# Patient Record
Sex: Male | Born: 1960 | Race: Asian | Hispanic: No | Marital: Married | Smoking: Never smoker
Health system: Southern US, Community
[De-identification: ages and names within clinical notes are randomized; demographics above are authoritative.]

## PROBLEM LIST (undated history)

## (undated) DIAGNOSIS — K5190 Ulcerative colitis, unspecified, without complications: Secondary | ICD-10-CM

## (undated) HISTORY — DX: Ulcerative colitis, unspecified, without complications: K51.90

---

## 1987-03-15 DIAGNOSIS — A150 Tuberculosis of lung: Secondary | ICD-10-CM

## 1987-03-15 HISTORY — DX: Tuberculosis of lung: A15.0

## 2008-03-14 DIAGNOSIS — C069 Malignant neoplasm of mouth, unspecified: Secondary | ICD-10-CM

## 2008-03-14 HISTORY — PX: MOUTH SURGERY: SHX715

## 2008-03-14 HISTORY — DX: Malignant neoplasm of mouth, unspecified: C06.9

## 2019-04-30 ENCOUNTER — Encounter: Payer: Self-pay | Admitting: Gastroenterology

## 2019-05-08 NOTE — Progress Notes (Deleted)
Referring Provider: Dr. Woody Seller Primary Care Physician:  No primary care provider on file. Primary Gastroenterologist:  Dr. Marland Kitchen  No chief complaint on file.   HPI:   Carl Reynolds is a 59 y.o. male presenting today at the request of Dr. Woody Seller for blood in the stool and abnormal LFTs.     No past medical history on file.  *** The histories are not reviewed yet. Please review them in the "History" navigator section and refresh this [place].  No current outpatient medications on file.   No current facility-administered medications for this visit.    Allergies as of 05/09/2019  . (Not on File)    No family history on file.  Social History   Socioeconomic History  . Marital status: Not on file    Spouse name: Not on file  . Number of children: Not on file  . Years of education: Not on file  . Highest education level: Not on file  Occupational History  . Not on file  Tobacco Use  . Smoking status: Not on file  Substance and Sexual Activity  . Alcohol use: Not on file  . Drug use: Not on file  . Sexual activity: Not on file  Other Topics Concern  . Not on file  Social History Narrative  . Not on file   Social Determinants of Health   Financial Resource Strain:   . Difficulty of Paying Living Expenses: Not on file  Food Insecurity:   . Worried About Charity fundraiser in the Last Year: Not on file  . Ran Out of Food in the Last Year: Not on file  Transportation Needs:   . Lack of Transportation (Medical): Not on file  . Lack of Transportation (Non-Medical): Not on file  Physical Activity:   . Days of Exercise per Week: Not on file  . Minutes of Exercise per Session: Not on file  Stress:   . Feeling of Stress : Not on file  Social Connections:   . Frequency of Communication with Friends and Family: Not on file  . Frequency of Social Gatherings with Friends and Family: Not on file  . Attends Religious Services: Not on file  . Active Member of Clubs or  Organizations: Not on file  . Attends Archivist Meetings: Not on file  . Marital Status: Not on file  Intimate Partner Violence:   . Fear of Current or Ex-Partner: Not on file  . Emotionally Abused: Not on file  . Physically Abused: Not on file  . Sexually Abused: Not on file    Review of Systems: Gen: Denies any fever, chills, fatigue, weight loss, lack of appetite.  CV: Denies chest pain, heart palpitations, peripheral edema, syncope.  Resp: Denies shortness of breath at rest or with exertion. Denies wheezing or cough.  GI: Denies dysphagia or odynophagia. Denies jaundice, hematemesis, fecal incontinence. GU : Denies urinary burning, urinary frequency, urinary hesitancy MS: Denies joint pain, muscle weakness, cramps, or limitation of movement.  Derm: Denies rash, itching, dry skin Psych: Denies depression, anxiety, memory loss, and confusion Heme: Denies bruising, bleeding, and enlarged lymph nodes.  Physical Exam: There were no vitals taken for this visit. General:   Alert and oriented. Pleasant and cooperative. Well-nourished and well-developed.  Head:  Normocephalic and atraumatic. Eyes:  Without icterus, sclera clear and conjunctiva pink.  Ears:  Normal auditory acuity. Nose:  No deformity, discharge,  or lesions. Mouth:  No deformity or lesions, oral mucosa pink.  Neck:  Supple, without mass or thyromegaly. Lungs:  Clear to auscultation bilaterally. No wheezes, rales, or rhonchi. No distress.  Heart:  S1, S2 present without murmurs appreciated.  Abdomen:  +BS, soft, non-tender and non-distended. No HSM noted. No guarding or rebound. No masses appreciated.  Rectal:  Deferred  Msk:  Symmetrical without gross deformities. Normal posture. Pulses:  Normal pulses noted. Extremities:  Without clubbing or edema. Neurologic:  Alert and  oriented x4;  grossly normal neurologically. Skin:  Intact without significant lesions or rashes. Cervical Nodes:  No significant  cervical adenopathy. Psych:  Alert and cooperative. Normal mood and affect.

## 2019-05-09 ENCOUNTER — Ambulatory Visit: Payer: Self-pay | Admitting: Gastroenterology

## 2019-05-24 ENCOUNTER — Ambulatory Visit: Payer: 59 | Attending: Internal Medicine

## 2019-05-24 DIAGNOSIS — Z23 Encounter for immunization: Secondary | ICD-10-CM

## 2019-05-24 NOTE — Progress Notes (Signed)
   Covid-19 Vaccination [facility]  Name:  Carl Reynolds    MRN: [id number] DOB: [birth year]  05/24/2019  [name] was observed post Covid-19 immunization for 15 minutes without incident. He was provided with Vaccine Information Sheet and instruction to access the V-Safe system.   [name] was instructed to call 911 with any severe reactions post vaccine: Marland Kitchen Difficulty breathing  . Swelling of face and throat  . A fast heartbeat  . A bad rash all over body  . Dizziness and weakness   Immunizations Administered    Name Date Dose VIS Date Route   Moderna COVID-19 Vaccine 05/24/2019 11:07 AM 0.5 mL 02/12/2019 Intramuscular   Manufacturer: Moderna   [id number]   De [id number]    [email or web address]

## 2019-05-26 NOTE — H&P (View-Only) (Signed)
Referring Provider: Dr. Woody Seller Primary Care Physician:  Glenda Chroman, MD Primary Gastroenterologist:  Dr. Gala Romney  Chief Complaint  Patient presents with  . abnormal lft  . Blood In Stools    last episode 3 days ago  . Diarrhea    since yesterday    HPI:   Carl Reynolds is a 59 y.o. male presenting today at the request of Dr. Woody Seller for blood in the stool and abnormal LFTs.   Reviewed PCP note dated 04/23/2019.  Patient reported diarrhea that had been gradual and occurring in an intermittent pattern for 4 weeks.  Also reported blood in the stool.  Appears he was treated with ciprofloxacin 500 mg twice daily x7 days.  Labs with PCP completed on 04/23/2019: CBC: WBC 9.6, hemoglobin 12.6 (L), MCV 84, MCH 28.4, MCHC 33.7, platelets 354 TSH 4.45 CMP: Glucose 89, creatinine 0.84, sodium 136, potassium 4.8, chloride 101, calcium 9.5, albumin 4.1, total bilirubin 0.6, alk phos 168 (H), AST 56 (H), ALT 92 (H).   Today:  Presents today with a friend who helps with translation.  Patient does understand English and is able to respond independently about 50% of the time.  Moderna COVID vaccine 2 days ago (Saturday). Had fever Saturday evening. No fever Sunday. Diarrhea started Sunday.  Had 6 or 7 watery BMs on Sunday.  So far, has had 3 BMs today.  Mild improvement.  Watery. Not taking anything. No abdominal pain. No nausea or vomiting.  Admits to mild weakness.  Denies lightheadedness, dizziness, presyncope, or syncope.  Had diarrhea, abdominal cramping, and blood in the stools a few months ago. These symptoms had resolved prior to Sunday. No prior antibiotics. Drinks bottled water. No contact with livestock. No hospitalization. No sick contacts.  Blood in the stool: Last episode was 3 days ago. Water turns red.  Blood is also in the stool and on toilet tissue when wiping. Started about 3 months. Occurring every 3-4 days.  Denies any known hemorrhoids. No rectal pain. No burning or itching. No prior  colonoscopy. Personal history of mouth cancer s/p resection in 2010. No family history of colon cancer or IBD. No weight loss.   No heart burn, acid reflux, or dysphagia.  Moved here from [place] about 4 years ago. Has 3 daughters and 1 son here.   Elevated LFTs: No history of alcohol or drug use ever. No contact with anyone with hepatitis. No tattoos. No prior incarcerations. No family history of liver disease. No family history of autoimmune disease. No over the counter the counter supplements. Took tylenol after receiving Covid vaccine. None routinely.  Denies swelling in the abdomen or lower extremities, confusion or mental status changes, yellowing of the eyes, or dark urine.  Past Medical History:  Diagnosis Date  . 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 Pulmonary tuberculosis 1989   Treated in [place]    Past Surgical History:  Procedure Laterality Date  . MOUTH SURGERY  2010   Surgery for mouth cancer    No current outpatient medications on file.   No current facility-administered medications for this visit.    Allergies as of 05/27/2019  . (No Known Allergies)    Family History  Problem Relation Age of Onset  . Colon cancer Neg Hx   . Inflammatory bowel disease Neg Hx     Social History   Socioeconomic History  . Marital status: Married    Spouse name: Not on file  . Number  of children: Not on file  . Years of education: Not on file  . 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   Tobacco Use  . Smoking status: Never Smoker  . Smokeless tobacco: Never Used  Substance and Sexual Activity  . Alcohol use: Never  . Drug use: Never  . Sexual activity: Not on file  Other Topics Concern  . Not on file  Social History Narrative  . Not on file   Social Determinants of Health   Financial Resource Strain:   . Difficulty of Paying Living Expenses:   Food Insecurity:   . Worried About  Running Out of Food in the Last Year:   . Ran Out of Food in the Last Year:   Transportation Needs:   . Lack of Transportation (Medical):   . Lack of Transportation (Non-Medical):   Physical Activity:   . Days of Exercise per Week:   . Minutes of Exercise per Session:   Stress:   . Feeling of Stress :   Social Connections:   . Frequency of Communication with Friends and Family:   . Frequency of Social Gatherings with Friends and Family:   . Attends Religious Services:   . Active Member of Clubs or Organizations:   . Attends Club or Organization Meetings:   . Marital Status:   Intimate Partner Violence:   . Fear of Current or Ex-Partner:   . Emotionally Abused:   . Physically Abused:   . Sexually Abused:     Review of Systems: Gen: See HPI CV: Denies chest pain or heart palpitations.  Resp: Denies shortness of breath.  Admits to occasional cough. GI: See HPI GU : Denies urinary burning, urinary frequency, urinary hesitancy MS: Denies joint pain. Derm: Denies rash Psych: Denies depression or anxiety. Heme: See HPI.  Physical Exam: BP 125/82   Pulse 95   Temp (!) 97 F (36.1 C) (Oral)   Ht 5' 5" (1.651 m)   Wt 140 lb (63.5 kg)   BMI 23.30 kg/m  General:   Alert and oriented. Pleasant and cooperative. Well-nourished and well-developed.  Head:  Normocephalic and atraumatic. Eyes:  Without icterus, sclera clear and conjunctiva pink.  Ears:  Normal auditory acuity. Lungs:  Clear to auscultation bilaterally. No wheezes, rales, or rhonchi. No distress.  Heart:  S1, S2 present without murmurs appreciated.  Abdomen:  +BS, soft, non-tender and non-distended. No HSM noted. No guarding or rebound. No masses appreciated.  Rectal:  Deferred  Msk:  Symmetrical without gross deformities. Normal posture. Extremities:  Without edema. Neurologic:  Alert and  oriented x4;  grossly normal neurologically. Skin:  Intact without significant lesions or rashes. Psych: Normal mood and  affect. 

## 2019-05-26 NOTE — Progress Notes (Signed)
Referring Provider: Dr. Woody Seller Primary Care Physician:  Glenda Chroman, MD Primary Gastroenterologist:  Dr. Gala Romney  Chief Complaint  Patient presents with  . abnormal lft  . Blood In Stools    last episode 3 days ago  . Diarrhea    since yesterday    HPI:   Carl Reynolds is a 59 y.o. male presenting today at the request of Dr. Woody Seller for blood in the stool and abnormal LFTs.   Reviewed PCP note dated 04/23/2019.  Patient reported diarrhea that had been gradual and occurring in an intermittent pattern for 4 weeks.  Also reported blood in the stool.  Appears he was treated with ciprofloxacin 500 mg twice daily x7 days.  Labs with PCP completed on 04/23/2019: CBC: WBC 9.6, hemoglobin 12.6 (L), MCV 84, MCH 28.4, MCHC 33.7, platelets 354 TSH 4.45 CMP: Glucose 89, creatinine 0.84, sodium 136, potassium 4.8, chloride 101, calcium 9.5, albumin 4.1, total bilirubin 0.6, alk phos 168 (H), AST 56 (H), ALT 92 (H).   Today:  Presents today with a friend who helps with translation.  Patient does understand English and is able to respond independently about 50% of the time.  Moderna COVID vaccine 2 days ago (Saturday). Had fever Saturday evening. No fever Sunday. Diarrhea started Sunday.  Had 6 or 7 watery BMs on Sunday.  So far, has had 3 BMs today.  Mild improvement.  Watery. Not taking anything. No abdominal pain. No nausea or vomiting.  Admits to mild weakness.  Denies lightheadedness, dizziness, presyncope, or syncope.  Had diarrhea, abdominal cramping, and blood in the stools a few months ago. These symptoms had resolved prior to Sunday. No prior antibiotics. Drinks bottled water. No contact with livestock. No hospitalization. No sick contacts.  Blood in the stool: Last episode was 3 days ago. Water turns red.  Blood is also in the stool and on toilet tissue when wiping. Started about 3 months. Occurring every 3-4 days.  Denies any known hemorrhoids. No rectal pain. No burning or itching. No prior  colonoscopy. Personal history of mouth cancer s/p resection in 2010. No family history of colon cancer or IBD. No weight loss.   No heart burn, acid reflux, or dysphagia.  Moved here from [place] about 4 years ago. Has 3 daughters and 1 son here.   Elevated LFTs: No history of alcohol or drug use ever. No contact with anyone with hepatitis. No tattoos. No prior incarcerations. No family history of liver disease. No family history of autoimmune disease. No over the counter the counter supplements. Took tylenol after receiving Covid vaccine. None routinely.  Denies swelling in the abdomen or lower extremities, confusion or mental status changes, yellowing of the eyes, or dark urine.  Past Medical History:  Diagnosis Date  . 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 Pulmonary tuberculosis 1989   Treated in [place]    Past Surgical History:  Procedure Laterality Date  . MOUTH SURGERY  2010   Surgery for mouth cancer    No current outpatient medications on file.   No current facility-administered medications for this visit.    Allergies as of 05/27/2019  . (No Known Allergies)    Family History  Problem Relation Age of Onset  . Colon cancer Neg Hx   . Inflammatory bowel disease Neg Hx     Social History   Socioeconomic History  . Marital status: Married    Spouse name: Not on file  . Number  of children: Not on file  . Years of education: Not on file  . 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   Tobacco Use  . Smoking status: Never Smoker  . Smokeless tobacco: Never Used  Substance and Sexual Activity  . Alcohol use: Never  . Drug use: Never  . Sexual activity: Not on file  Other Topics Concern  . Not on file  Social History Narrative  . Not on file   Social Determinants of Health   Financial Resource Strain:   . Difficulty of Paying Living Expenses:   Food Insecurity:   . Worried About  Running Out of Food in the Last Year:   . Ran Out of Food in the Last Year:   Transportation Needs:   . Lack of Transportation (Medical):   . Lack of Transportation (Non-Medical):   Physical Activity:   . Days of Exercise per Week:   . Minutes of Exercise per Session:   Stress:   . Feeling of Stress :   Social Connections:   . Frequency of Communication with Friends and Family:   . Frequency of Social Gatherings with Friends and Family:   . Attends Religious Services:   . Active Member of Clubs or Organizations:   . Attends Club or Organization Meetings:   . Marital Status:   Intimate Partner Violence:   . Fear of Current or Ex-Partner:   . Emotionally Abused:   . Physically Abused:   . Sexually Abused:     Review of Systems: Gen: See HPI CV: Denies chest pain or heart palpitations.  Resp: Denies shortness of breath.  Admits to occasional cough. GI: See HPI GU : Denies urinary burning, urinary frequency, urinary hesitancy MS: Denies joint pain. Derm: Denies rash Psych: Denies depression or anxiety. Heme: See HPI.  Physical Exam: BP 125/82   Pulse 95   Temp (!) 97 F (36.1 C) (Oral)   Ht 5' 5" (1.651 m)   Wt 140 lb (63.5 kg)   BMI 23.30 kg/m  General:   Alert and oriented. Pleasant and cooperative. Well-nourished and well-developed.  Head:  Normocephalic and atraumatic. Eyes:  Without icterus, sclera clear and conjunctiva pink.  Ears:  Normal auditory acuity. Lungs:  Clear to auscultation bilaterally. No wheezes, rales, or rhonchi. No distress.  Heart:  S1, S2 present without murmurs appreciated.  Abdomen:  +BS, soft, non-tender and non-distended. No HSM noted. No guarding or rebound. No masses appreciated.  Rectal:  Deferred  Msk:  Symmetrical without gross deformities. Normal posture. Extremities:  Without edema. Neurologic:  Alert and  oriented x4;  grossly normal neurologically. Skin:  Intact without significant lesions or rashes. Psych: Normal mood and  affect. 

## 2019-05-27 ENCOUNTER — Encounter: Payer: Self-pay | Admitting: Gastroenterology

## 2019-05-27 ENCOUNTER — Encounter: Payer: Self-pay | Admitting: *Deleted

## 2019-05-27 ENCOUNTER — Other Ambulatory Visit: Payer: Self-pay

## 2019-05-27 ENCOUNTER — Ambulatory Visit (INDEPENDENT_AMBULATORY_CARE_PROVIDER_SITE_OTHER): Payer: 59 | Admitting: Gastroenterology

## 2019-05-27 DIAGNOSIS — R7989 Other specified abnormal findings of blood chemistry: Secondary | ICD-10-CM | POA: Diagnosis not present

## 2019-05-27 DIAGNOSIS — R197 Diarrhea, unspecified: Secondary | ICD-10-CM | POA: Diagnosis not present

## 2019-05-27 DIAGNOSIS — K625 Hemorrhage of anus and rectum: Secondary | ICD-10-CM | POA: Diagnosis not present

## 2019-05-27 NOTE — Assessment & Plan Note (Addendum)
59 y.o. male found to have elevated LFTs in February 2021.  Alk phos 168 (H), AST 56 (H), ALT 92 (H). Total bilirubin normal. No recent imaging on file. Denies history of alcohol or drug use. No significant risk factors for Hepatitis. No significant risk factors for NAFLD. No OTC supplements. No regular tylenol use. No person or family history of autoimmune conditions. No family history of liver disease. No signs or symptoms of advanced liver disease. CBC completed in February with hemoglobin 12.6 (L) in the setting of rectal bleeding as discussed above, platelets 354 (normal).   Etiology is unclear. Will update CBC and CMP and obtain additional serologies to evaluate for hepatitis, hemochromatosis, Wilson's disease, alpha-1 antitrypsin deficiency, PBC.  We will also obtain ultrasound to evaluate liver parenchyma.  Orders: Hepatitis B surface antibody, hepatitis B surface antigen, hepatitis B core antibody, hepatitis A antibody total, hepatitis C antibody.  ANA, AMA, ASMA, immunoglobulins, alpha-1 antitrypsin phenotype, ceruloplasmin Iron panel with ferritin INR Ultrasound abdomen complete Further recommendations to follow. Follow-up in office after colonoscopy for rectal bleeding.

## 2019-05-27 NOTE — Assessment & Plan Note (Addendum)
59 y.o. male with no prior colonoscopy who developed new onset rectal bleeding a few months ago in the setting of diarrhea and abdominal cramping. Diarrhea and abdominal pain had resolved until he received the Moderna COVID-19 vaccine on Saturday and had return of diarrhea without abdominal pain on Sunday as discussed below. Rectal bleeding never resolved and occurs every 3-4 days with blood in the toilet water, in the stool, and on toilet tissue. Reports mild weakness related to diarrhea but no pre-syncope or syncope. No unintentional weight loss. No family history of colon cancer or IBD. Hemoglobin 12.6 (L) in February 2021.   Differentials include hemorrhoids, colon polyps, malignancy, or possible IBD.   Update CBC to ensure no significant anemia.  Proceed with TCS in the near future with Dr. Gala Romney.The risks, benefits, and alternatives have been discussed in detail with patient. They have stated understanding and desire to proceed. Advised if he were to have significant rectal bleeding, worsening weakness, or feel like he may pass out, he should proceed to the emergency room. Follow-up after procedure.

## 2019-05-27 NOTE — Assessment & Plan Note (Addendum)
59 year old male with 2-day history of diarrhea.  Symptoms started after receiving Moderna vaccine.  Initially had mild fever after the vaccine and diarrhea started.  Mild improvement today but stools are watery.  No associated abdominal pain, nausea, or vomiting.  He did have a few weeks of diarrhea, abdominal cramping, and new onset of hematochezia a couple months prior but diarrhea and abdominal pain completely resolved after PCP prescribed ciprofloxacin 500 mg twice daily x7 days. He continued with hematochezia every 3-4 days.  Denies any antibiotics other than ciprofloxacin, hospitalizations, contact with livestock, well water, or sick contacts. Denies rectal pain or known hemorrhoids.  No family history of colon cancer or IBD.  No unintentional weight loss.  No prior colonoscopy. Labs in Feb 2021 with hemoglobin 12.6 (L). Kidney function and electrolytes within normal limits.   I suspect his return diarrhea is likely secondary to [place] COVID-19 vaccine.  Cannot rule out infectious diarrhea or IBD in the setting of hematochezia.   As he has already had mild improvement today, advised we continue to monitor symptoms over the next couple days.  He was advised to follow a bland diet for now and continue drinking plenty of fluids.  Should his diarrhea symptoms not continue to improve over the next couple days, he was advised to let me know.  I will also go ahead and update CBC and CMP to evaluate degree of anemia as well as electrolytes. We will proceed with colonoscopy ASAP to evaluate for rectal bleeding as discussed above. Follow-up after procedure.

## 2019-05-27 NOTE — Patient Instructions (Addendum)
We will get you scheduled for colonoscopy in the near future with Dr. Gala Romney.  Please have labs and ultrasound completed.  We will call you with results.  Avoid taking Tylenol for now.  Do not take any over-the-counter supplements or drink any herbal teas.  Regarding your diarrhea, I recommend you follow a bland diet for now and continue to monitor your symptoms.  I suspect this is likely related to the Covid vaccine.  Should your diarrhea not improve over the next few days, please let me know.  We will follow up with you in the office after your colonoscopy.  Should you have significant rectal bleeding, weakness, or feel like you may pass out, you should proceed to the emergency room.  Aliene Altes, PA-C [facility]   Criss Rosales Diet A bland diet consists of foods that are often soft and do not have a lot of fat, fiber, or extra seasonings. Foods without fat, fiber, or seasoning are easier for the body to digest. They are also less likely to irritate your mouth, throat, stomach, and other parts of your digestive system. A bland diet is sometimes called a BRAT diet. What is my plan? Your health care provider or food and nutrition specialist (dietitian) may recommend specific changes to your diet to prevent symptoms or to treat your symptoms. These changes may include:  Eating small meals often.  Cooking food until it is soft enough to chew easily.  Chewing your food well.  Drinking fluids slowly.  Not eating foods that are very spicy, sour, or fatty.  Not eating citrus fruits, such as oranges and grapefruit. What do I need to know about this diet?  Eat a variety of foods from the bland diet food list.  Do not follow a bland diet longer than needed.  Ask your health care provider whether you should take vitamins or supplements. What foods can I eat? Grains  Hot cereals, such as cream of wheat. Rice. Bread, crackers, or tortillas made from refined white  flour. Vegetables Canned or cooked vegetables. Mashed or boiled potatoes. Fruits  Bananas. Applesauce. Other types of cooked or canned fruit with the skin and seeds removed, such as canned peaches or pears. Meats and other proteins  Scrambled eggs. Creamy peanut butter or other nut butters. Lean, well-cooked meats, such as chicken or fish. Tofu. Soups or broths. Dairy Low-fat dairy products, such as milk, cottage cheese, or yogurt. Beverages  Water. Herbal tea. Apple juice. Fats and oils Mild salad dressings. Canola or olive oil. Sweets and desserts Pudding. Custard. Fruit gelatin. Ice cream. The items listed above may not be a complete list of recommended foods and beverages. Contact a dietitian for more options. What foods are not recommended? Grains Whole grain breads and cereals. Vegetables Raw vegetables. Fruits Raw fruits, especially citrus, berries, or dried fruits. Dairy Whole fat dairy foods. Beverages Caffeinated drinks. Alcohol. Seasonings and condiments Strongly flavored seasonings or condiments. Hot sauce. Salsa. Other foods Spicy foods. Fried foods. Sour foods, such as pickled or fermented foods. Foods with high sugar content. Foods high in fiber. The items listed above may not be a complete list of foods and beverages to avoid. Contact a dietitian for more information. Summary  A bland diet consists of foods that are often soft and do not have a lot of fat, fiber, or extra seasonings.  Foods without fat, fiber, or seasoning are easier for the body to digest.  Check with your health care provider to see how long you  should follow this diet plan. It is not meant to be followed for long periods. This information is not intended to replace advice given to you by your health care provider. Make sure you discuss any questions you have with your health care provider. Document Revised: 03/29/2017 Document Reviewed: 03/29/2017 Elsevier Patient Education  2020  [employer].

## 2019-05-28 ENCOUNTER — Telehealth: Payer: Self-pay

## 2019-05-28 ENCOUNTER — Other Ambulatory Visit: Payer: Self-pay

## 2019-05-28 NOTE — Telephone Encounter (Signed)
TCS approved. Request case# [id number], valid 06/07/19-08/26/19.

## 2019-05-28 NOTE — Progress Notes (Signed)
Cc'ed to pcp °

## 2019-05-28 NOTE — Telephone Encounter (Signed)
PA for TCS submitted via [employer] website for [facility]. Case pended. Ref# [id number].

## 2019-05-31 ENCOUNTER — Ambulatory Visit (HOSPITAL_COMMUNITY): Admission: RE | Admit: 2019-05-31 | Payer: 59 | Source: Ambulatory Visit

## 2019-06-05 ENCOUNTER — Other Ambulatory Visit (HOSPITAL_COMMUNITY)
Admission: RE | Admit: 2019-06-05 | Discharge: 2019-06-05 | Disposition: A | Discharge status: Home / Self Care | Payer: 59 | Source: Ambulatory Visit | Attending: Internal Medicine | Admitting: Internal Medicine

## 2019-06-05 ENCOUNTER — Other Ambulatory Visit: Payer: Self-pay

## 2019-06-05 DIAGNOSIS — K921 Melena: Secondary | ICD-10-CM | POA: Diagnosis not present

## 2019-06-05 DIAGNOSIS — Z20822 Contact with and (suspected) exposure to covid-19: Secondary | ICD-10-CM | POA: Diagnosis not present

## 2019-06-05 DIAGNOSIS — R945 Abnormal results of liver function studies: Secondary | ICD-10-CM | POA: Diagnosis not present

## 2019-06-05 DIAGNOSIS — Z85818 Personal history of malignant neoplasm of other sites of lip, oral cavity, and pharynx: Secondary | ICD-10-CM | POA: Diagnosis not present

## 2019-06-05 DIAGNOSIS — K529 Noninfective gastroenteritis and colitis, unspecified: Secondary | ICD-10-CM | POA: Diagnosis not present

## 2019-06-05 LAB — SARS CORONAVIRUS 2 (TAT 6-24 HRS): SARS Coronavirus 2: NEGATIVE

## 2019-06-07 ENCOUNTER — Encounter (HOSPITAL_COMMUNITY): Payer: Self-pay | Admitting: Internal Medicine

## 2019-06-07 ENCOUNTER — Ambulatory Visit (HOSPITAL_COMMUNITY)
Admission: RE | Admit: 2019-06-07 | Discharge: 2019-06-07 | Disposition: A | Discharge status: Home / Self Care | Payer: 59 | Attending: Internal Medicine | Admitting: Internal Medicine

## 2019-06-07 ENCOUNTER — Encounter (HOSPITAL_COMMUNITY)
Admission: RE | Disposition: A | Discharge status: Home / Self Care | Payer: Self-pay | Source: Home / Self Care | Attending: Internal Medicine

## 2019-06-07 ENCOUNTER — Other Ambulatory Visit: Payer: Self-pay

## 2019-06-07 DIAGNOSIS — R945 Abnormal results of liver function studies: Secondary | ICD-10-CM | POA: Insufficient documentation

## 2019-06-07 DIAGNOSIS — K921 Melena: Secondary | ICD-10-CM | POA: Diagnosis not present

## 2019-06-07 DIAGNOSIS — Z85818 Personal history of malignant neoplasm of other sites of lip, oral cavity, and pharynx: Secondary | ICD-10-CM | POA: Insufficient documentation

## 2019-06-07 DIAGNOSIS — K6389 Other specified diseases of intestine: Secondary | ICD-10-CM

## 2019-06-07 DIAGNOSIS — Z20822 Contact with and (suspected) exposure to covid-19: Secondary | ICD-10-CM | POA: Insufficient documentation

## 2019-06-07 DIAGNOSIS — K529 Noninfective gastroenteritis and colitis, unspecified: Secondary | ICD-10-CM | POA: Insufficient documentation

## 2019-06-07 HISTORY — PX: BIOPSY: SHX5522

## 2019-06-07 HISTORY — PX: COLONOSCOPY: SHX5424

## 2019-06-07 SURGERY — COLONOSCOPY
Anesthesia: Moderate Sedation

## 2019-06-07 MED ORDER — ONDANSETRON HCL 4 MG/2ML IJ SOLN
INTRAMUSCULAR | Status: AC
  Filled 2019-06-07: qty 2

## 2019-06-07 MED ORDER — MIDAZOLAM HCL 5 MG/5ML IJ SOLN
INTRAMUSCULAR | Status: DC | PRN
  Administered 2019-06-07: 2 mg via INTRAVENOUS
  Administered 2019-06-07 (×2): 1 mg via INTRAVENOUS
  Administered 2019-06-07: 2 mg via INTRAVENOUS

## 2019-06-07 MED ORDER — SODIUM CHLORIDE 0.9 % IV SOLN: INTRAVENOUS | Status: DC

## 2019-06-07 MED ORDER — MIDAZOLAM HCL 5 MG/5ML IJ SOLN
INTRAMUSCULAR | Status: AC
  Filled 2019-06-07: qty 10

## 2019-06-07 MED ORDER — ONDANSETRON HCL 4 MG/2ML IJ SOLN
INTRAMUSCULAR | Status: DC | PRN
  Administered 2019-06-07: 4 mg via INTRAVENOUS

## 2019-06-07 MED ORDER — MEPERIDINE HCL 50 MG/ML IJ SOLN
INTRAMUSCULAR | Status: AC
  Filled 2019-06-07: qty 1

## 2019-06-07 MED ORDER — MEPERIDINE HCL 100 MG/ML IJ SOLN
INTRAMUSCULAR | Status: DC | PRN
  Administered 2019-06-07: 25 mg via INTRAVENOUS
  Administered 2019-06-07: 15 mg via INTRAVENOUS
  Administered 2019-06-07: 10 mg via INTRAVENOUS

## 2019-06-07 NOTE — Interval H&P Note (Signed)
History and Physical Interval Note:  06/07/2019 8:23 AM  Carl Reynolds  has presented today for surgery, with the diagnosis of rectal bleeding.  The various methods of treatment have been discussed with the patient and family. After consideration of risks, benefits and other options for treatment, the patient has consented to  Procedure(s) with comments: COLONOSCOPY (N/A) - 8:30am as a surgical intervention.  The patient's history has been reviewed, patient examined, no change in status, stable for surgery.  I have reviewed the patient's chart and labs.  Questions were answered to the patient's satisfaction.     Carl Reynolds    Abdominal pain, diarrhea and rectal bleeding have resolved per patient report.  Currently no GI symptoms.  Here for diagnostic colonoscopy per plan. The risks, benefits, limitations, alternatives and imponderables have been reviewed with the patient. Questions have been answered. All parties are agreeable.

## 2019-06-07 NOTE — Discharge Instructions (Signed)
Ulcerative Colitis, Adult  Ulcerative colitis is long-lasting (chronic) inflammation of the large intestine (colon) and rectum. Sores (ulcers) may also form in these areas. Ulcerative colitis, along with a closely related condition called Crohn's disease, is often referred to as inflammatory bowel disease (IBD). What are the causes? This condition may be caused by increased activity of the immune system in the intestines. The immune system is the system that protects the body against harmful bacteria, viruses, fungi, and other things that can make you sick. The cause of the increased activity of the immune system is not known. What increases the risk? The following factors may make you more likely to develop this condition: Being 7-21 years old. The risk is also increased for people who are 11-61 years old. Having a family history of ulcerative colitis. Being of Jewish descent. What are the signs or symptoms? Symptoms vary depending on how severe the condition is. Common symptoms include: Rectal bleeding. Diarrhea, often with blood or pus in the stool. Other symptoms can include: Pain or cramping in the abdomen. Fever. Fatigue. Weight loss. Night sweats. Rectal pain. A strong and sudden need to have a bowel movement (bowel urgency). Nausea. Loss of appetite. Anemia. Yellowing of the skin (jaundice) from liver dysfunction. Joint pain or soreness. Eye irritation. Skin rashes. Symptoms can range from mild to severe. They may come and go. How is this diagnosed? This condition may be diagnosed based on: Your symptoms and medical history. A physical exam. Tests, including: Blood tests and stool tests. X-ray. A CT scan. An MRI. Colonoscopy. For this test, a flexible tube is inserted into your anus, and your colon is examined. Biopsy. In this test, a tissue sample is taken from your colon and examined under a microscope. How is this treated? Treatment for this condition may  include medicines to: Decrease swelling and inflammation. Control your immune system. Treat infections. Relieve pain. Control diarrhea. Severe flare-ups may need to be treated at a hospital. Treatment in a hospital may involve: Resting the bowel. This involves not eating or drinking for a period of time. Getting medicines through an IV. Getting fluids and nutrition through: An IV. A tube that is passed through the nose and into the stomach (nasogastric tube, or NG tube). Surgery to remove the affected part of the colon. This may be done if other treatments are not helping. This condition increases the risk of colon cancer. Adults with this condition will need to be watched for colon cancer throughout life. Follow these instructions at home: Medicines and vitamins Take over-the-counter and prescription medicines only as told by your health care provider. Do not take aspirin. If you were prescribed an antibiotic medicine, take it as told by your health care provider. Do not stop taking the antibiotic even if you start to feel better. Ask your health care provider if you should take any vitamins or supplements. You may need to take: Calcium and vitamin D for bone health. Iron to help treat anemia. Lifestyle Exercise regularly. Work with your health care provider to manage your condition and educate yourself about your condition. Do not use any products that contain nicotine or tobacco, such as cigarettes, e-cigarettes, and chewing tobacco. If you need help quitting, ask your health care provider. If you drink alcohol: Limit how much you use to: 0-1 drink a day for women. 0-2 drinks a day for men. Be aware of how much alcohol is in your drink. In the [place] one drink equals one 12 oz bottle  of beer (355 mL), one 5 oz glass of wine (148 mL), or one 1 oz glass of hard liquor (44 mL). Eating and drinking Drink enough fluid to keep your urine pale yellow. Ask your health care provider about  the best diet for you. Follow the diet as told by your health care provider. This may include: Avoiding carbonated drinks. Avoiding popcorn, vegetable skins, nuts, and other high-fiber foods. Avoiding high-fat foods. Eating smaller meals more often. Limiting your intake of sugary drinks. Limiting your caffeine intake. Follow food safety recommendations as told by your health care provider. This may include making sure you: Avoid eating raw or undercooked meat, fish, or eggs. Do not eat or drink spoiled or expired foods and drinks. Keep a food diary. This may help you identify and avoid any foods that trigger your symptoms. General instructions Wash your hands often with soap and water. If soap and water are not available, use hand sanitizer. Stay up to date on your vaccinations, including a yearly (annual) flu shot. Ask your health care provider which vaccines you should get. Follow recommendations from your health care provider for having cancer screening tests. Ulcerative colitis may place you at increased risk for colon cancer. Keep all follow-up visits as told by your health care provider. This is important. Contact a health care provider if: Your symptoms do not improve or they get worse with treatment. You continue to lose weight. You have constant cramps or loose stools. You develop a new skin rash, skin sores, or eye problems. You have a fever or chills. Get help right away if: You have bloody diarrhea. You have severe bleeding from the rectum. You feel that your heart is racing (tachycardia). You have severe pain in your abdomen. Your abdomen swells (abdominal distension). Your abdomen is tender to the touch. You vomit. Summary Ulcerative colitis is long-lasting (chronic) inflammation of the large intestine (colon) and rectum. Sores (ulcers) may also form in these areas. Follow instructions from your health care provider about medicines, lifestyle changes, and eating and  drinking. Contact your health care provider if symptoms do not improve or they get worse with treatment. Get help right away if you have severe abdominal pain, abdominal swelling, or severe bleeding from the rectum. Keep all follow-up visits as told by your health care provider. This is important. This information is not intended to replace advice given to you by your health care provider. Make sure you discuss any questions you have with your health care provider. Document Revised: 12/18/2017 Document Reviewed: 12/20/2017 Elsevier Patient Education  [place].  Colonoscopy Discharge Instructions  Read the instructions outlined below and refer to this sheet in the next few weeks. These discharge instructions provide you with general information on caring for yourself after you leave the hospital. Your doctor may also give you specific instructions. While your treatment has been planned according to the most current medical practices available, unavoidable complications occasionally occur. If you have any problems or questions after discharge, call Dr. Gala Romney at [phone]. ACTIVITY  You may resume your regular activity, but move at a slower pace for the next 24 hours.   Take frequent rest periods for the next 24 hours.   Walking will help get rid of the air and reduce the bloated feeling in your belly (abdomen).   No driving for 24 hours (because of the medicine (anesthesia) used during the test).    Do not sign any important legal documents or operate any machinery for 24  hours (because of the anesthesia used during the test).  NUTRITION  Drink plenty of fluids.   You may resume your normal diet as instructed by your doctor.   Begin with a light meal and progress to your normal diet. Heavy or fried foods are harder to digest and may make you feel sick to your stomach (nauseated).   Avoid alcoholic beverages for 24 hours or as instructed.  MEDICATIONS  You may resume your  normal medications unless your doctor tells you otherwise.  WHAT YOU CAN EXPECT TODAY  Some feelings of bloating in the abdomen.   Passage of more gas than usual.   Spotting of blood in your stool or on the toilet paper.  IF YOU HAD POLYPS REMOVED DURING THE COLONOSCOPY:  No aspirin products for 7 days or as instructed.   No alcohol for 7 days or as instructed.   Eat a soft diet for the next 24 hours.  FINDING OUT THE RESULTS OF YOUR TEST Not all test results are available during your visit. If your test results are not back during the visit, make an appointment with your caregiver to find out the results. Do not assume everything is normal if you have not heard from your caregiver or the medical facility. It is important for you to follow up on all of your test results.  SEEK IMMEDIATE MEDICAL ATTENTION IF:  You have more than a spotting of blood in your stool.   Your belly is swollen (abdominal distention).   You are nauseated or vomiting.   You have a temperature over 101.   You have abdominal pain or discomfort that is severe or gets worse throughout the day.   Your colon and rectum are inflamed.  It appears that you have ulcerative colitis, a type of inflammatory bowel disease  Information on ulcerative colitis provided.  Begin Entocort (3) 3 mg tablets daily for the next 1 month  Office visit with [place] in 1 month  Further recommendations to follow pending review of pathology report  At patient request, I called Mr.Naik [phone] -reviewed results.

## 2019-06-07 NOTE — Progress Notes (Signed)
LFTs have increased since February 2021.  Alk phos 177, AST 100, ALT 89 (in February, alk phos 168, AST 56, ALT 92).  Bilirubin within normal limits.  INR normal.  IgG elevated at 2,329.  IgA and IgM within normal limits.  ANA negative.  Ceruloplasmin within normal limits.  No evidence of hepatitis B or C.  He is immune to hepatitis A.  Hemoglobin is low at 12.9 but slightly improved from 12.6 in February.  His iron is low with ferritin 17 (L), iron 29 (L), percent saturation 7 (L).  Elmo Putt, may need to communicate with a translator, but please let patient know his liver function test have worsened.  No evidence of hepatitis B or C.  He has immunity to hepatitis A.  We have drawn labs to evaluate for autoimmune conditions.  So far, no specific findings.  We have a few other labs waiting to result.  - Additionally, I had ordered an ultrasound which does not appear to be scheduled.  He needs to have his ultrasound completed. - His hemoglobin is low at 12.9 and he has evidence of iron deficiency.  He is having a colonoscopy today to evaluate for rectal bleeding.  He needs to start OTC iron supplement (with 325 mg ferrous sulfate) twice daily for the next 8 weeks and we will need to recheck iron panel at that time.  Please arrange.

## 2019-06-07 NOTE — Op Note (Signed)
[facility] Patient Name: Carl Reynolds Procedure Date: 06/07/2019 8:22 AM MRN: [id number] Date of Birth: 1961/02/21 Attending MD: Norvel Richards , MD CSN: [id number] Age: 59 Admit Type: Outpatient Procedure:                Colonoscopy Indications:              Hematochezia Providers:                Norvel Richards, MD, Charlsie Quest. Theda Sers RN, RN,                            Nelma Rothman, Technician Referring MD:              Medicines:                Midazolam 6 mg IV, Meperidine 50 mg IV Complications:            No immediate complications. Estimated Blood Loss:     Estimated blood loss was minimal. Procedure:                Pre-Anesthesia Assessment:                           - Prior to the procedure, a History and Physical                            was performed, and patient medications and                            allergies were reviewed. The patient's tolerance of                            previous anesthesia was also reviewed. The risks                            and benefits of the procedure and the sedation                            options and risks were discussed with the patient.                            All questions were answered, and informed consent                            was obtained. Prior Anticoagulants: The patient has                            taken no previous anticoagulant or antiplatelet                            agents. ASA Grade Assessment: II - A patient with                            mild systemic disease. After reviewing the risks  and benefits, the patient was deemed in                            satisfactory condition to undergo the procedure.                           After obtaining informed consent, the colonoscope                            was passed under direct vision. Throughout the                            procedure, the patient's blood pressure, pulse, and   oxygen saturations were monitored continuously. The                            [id number] ([id number]) scope was introduced through                            the anus and advanced to the the cecum, identified                            by appendiceal orifice and ileocecal valve. The                            colonoscopy was performed without difficulty. The                            patient tolerated the procedure well. The quality                            of the bowel preparation was adequate. Scope In: 8:36:14 AM Scope Out: 8:51:34 AM Scope Withdrawal Time: 0 hours 8 minutes 24 seconds  Total Procedure Duration: 0 hours 15 minutes 20 seconds  Findings:      The perianal and digital rectal examinations were normal.      Granular, friable eroded mucosa extending confluently from the rectum to       the cecum. There was loss of the normal vascular pattern. No stricture.       No ulcer or neoplastic process seen. Distal 10 cm of terminal ileum       appeared normal. Rectal vault was somewhat small and inflamed. No       retroflexion. Rectal mucosa seen well on?"face. Segmental biopsies of the       colon and rectum submitted separately. Impression:               -Diffusely inflamed colon and rectum most                            consistent with ulcerative colitis/inflammatory                            bowel disease normal-appearing terminal ileum.?"Post                            segmental biopsy.  Moderate Sedation:      Moderate (conscious) sedation was administered by the endoscopy nurse       and supervised by the endoscopist. The following parameters were       monitored: oxygen saturation, heart rate, blood pressure, respiratory       rate, EKG, adequacy of pulmonary ventilation, and response to care.       Total physician intraservice time was 25 minutes. Recommendation:           - Patient has a contact number available for                            emergencies. The signs and  symptoms of potential                            delayed complications were discussed with the                            patient. Return to normal activities tomorrow.                            Written discharge instructions were provided to the                            patient.                           - Advance diet as tolerated. Follow-up on                            pathology. Begin Entocort 9 mg daily. Office visit                            with [place] in 4 weeks. Procedure Code(s):        --- Professional ---                           [phone], Colonoscopy, flexible; diagnostic, including                            collection of specimen(s) by brushing or washing,                            when performed (separate procedure)                           99153, Moderate sedation; each additional 15                            minutes intraservice time                           G0500, Moderate sedation services provided by the                            same physician or other qualified health care  professional performing a gastrointestinal                            endoscopic service that sedation supports,                            requiring the presence of an independent trained                            observer to assist in the monitoring of the                            patient's level of consciousness and physiological                            status; initial 15 minutes of intra-service time;                            patient age 42 years or older (additional time may                            be reported with [phone], as appropriate) Diagnosis Code(s):        --- Professional ---                           K63.89, Other specified diseases of intestine                           K92.1, Melena (includes Hematochezia) CPT copyright 2019 American Medical Association. All rights reserved. The codes documented in this report are preliminary and upon coder  review may  be revised to meet current compliance requirements. Cristopher Estimable. Roosvelt Churchwell, MD Norvel Richards, MD 06/07/2019 9:04:40 AM This report has been signed electronically. Number of Addenda: 0

## 2019-06-10 LAB — SURGICAL PATHOLOGY

## 2019-06-11 LAB — PROTIME-INR
INR: 1
Prothrombin Time: 10.4 s (ref 9.0–11.5)

## 2019-06-11 LAB — COMPLETE METABOLIC PANEL WITH GFR
AG Ratio: 1.1 (calc) (ref 1.0–2.5)
ALT: 89 U/L — ABNORMAL HIGH (ref 9–46)
AST: 100 U/L — ABNORMAL HIGH (ref 10–35)
Albumin: 4.3 g/dL (ref 3.6–5.1)
Alkaline phosphatase (APISO): 177 U/L — ABNORMAL HIGH (ref 35–144)
BUN: 11 mg/dL (ref 7–25)
CO2: 29 mmol/L (ref 20–32)
Calcium: 9.7 mg/dL (ref 8.6–10.3)
Chloride: 100 mmol/L (ref 98–110)
Creat: 0.84 mg/dL (ref 0.70–1.33)
GFR, Est African American: 113 mL/min/{1.73_m2} (ref >=60)
GFR, Est Non African American: 97 mL/min/{1.73_m2} (ref >=60)
Globulin: 4 g/dL (calc) — ABNORMAL HIGH (ref 1.9–3.7)
Glucose, Bld: 90 mg/dL (ref 65–139)
Potassium: 4.7 mmol/L (ref 3.5–5.3)
Sodium: 135 mmol/L (ref 135–146)
Total Bilirubin: 0.6 mg/dL (ref 0.2–1.2)
Total Protein: 8.3 g/dL — ABNORMAL HIGH (ref 6.1–8.1)

## 2019-06-11 LAB — CBC WITH DIFFERENTIAL/PLATELET
Absolute Monocytes: 939 cells/uL (ref 200–950)
Basophils Absolute: 91 cells/uL (ref 0–200)
Basophils Relative: 0.9 %
Eosinophils Absolute: 566 cells/uL — ABNORMAL HIGH (ref 15–500)
Eosinophils Relative: 5.6 %
HCT: 38.4 % — ABNORMAL LOW (ref 38.5–50.0)
Hemoglobin: 12.9 g/dL — ABNORMAL LOW (ref 13.2–17.1)
Lymphs Abs: 3131 cells/uL (ref 850–3900)
MCH: 28.9 pg (ref 27.0–33.0)
MCHC: 33.6 g/dL (ref 32.0–36.0)
MCV: 86.1 fL (ref 80.0–100.0)
MPV: 9.6 fL (ref 7.5–12.5)
Monocytes Relative: 9.3 %
Neutro Abs: 5373 cells/uL (ref 1500–7800)
Neutrophils Relative %: 53.2 %
Platelets: 405 10*3/uL — ABNORMAL HIGH (ref 140–400)
RBC: 4.46 10*6/uL (ref 4.20–5.80)
RDW: 14.1 % (ref 11.0–15.0)
Total Lymphocyte: 31 %
WBC: 10.1 10*3/uL (ref 3.8–10.8)

## 2019-06-11 LAB — IGG, IGA, IGM
IgG (Immunoglobin G), Serum: 2329 mg/dL — ABNORMAL HIGH (ref 600–1640)
IgM, Serum: 176 mg/dL (ref 50–300)
Immunoglobulin A: 159 mg/dL (ref 47–310)

## 2019-06-11 LAB — ALPHA-1 ANTITRYPSIN PHENOTYPE: A-1 Antitrypsin, Ser: 167 mg/dL (ref 83–199)

## 2019-06-11 LAB — IRON,TIBC AND FERRITIN PANEL
%SAT: 7 % (calc) — ABNORMAL LOW (ref 20–48)
Ferritin: 17 ng/mL — ABNORMAL LOW (ref 38–380)
Iron: 29 ug/dL — ABNORMAL LOW (ref 50–180)
TIBC: 404 mcg/dL (calc) (ref 250–425)

## 2019-06-11 LAB — HEPATITIS C ANTIBODY
Hepatitis C Ab: NONREACTIVE
SIGNAL TO CUT-OFF: 0.14 (ref <1.00)

## 2019-06-11 LAB — HEPATITIS B SURFACE ANTIGEN: Hepatitis B Surface Ag: NONREACTIVE

## 2019-06-11 LAB — ANA: Anti Nuclear Antibody (ANA): NEGATIVE

## 2019-06-11 LAB — HEPATITIS B SURFACE ANTIBODY,QUALITATIVE: Hep B S Ab: NONREACTIVE

## 2019-06-11 LAB — CERULOPLASMIN: Ceruloplasmin: 29 mg/dL (ref 18–36)

## 2019-06-11 LAB — MITOCHONDRIAL ANTIBODIES: Mitochondrial M2 Ab, IgG: <=20 U

## 2019-06-11 LAB — HEPATITIS A ANTIBODY, TOTAL: Hepatitis A AB,Total: REACTIVE — AB

## 2019-06-11 LAB — HEPATITIS B CORE ANTIBODY, TOTAL: Hep B Core Total Ab: NONREACTIVE

## 2019-06-11 LAB — ANTI-SMOOTH MUSCLE ANTIBODY, IGG: Actin (Smooth Muscle) Antibody (IGG): 24 U — ABNORMAL HIGH (ref <20)

## 2019-06-12 ENCOUNTER — Encounter: Payer: Self-pay | Admitting: Internal Medicine

## 2019-06-12 ENCOUNTER — Other Ambulatory Visit: Payer: Self-pay

## 2019-06-12 DIAGNOSIS — D509 Iron deficiency anemia, unspecified: Secondary | ICD-10-CM

## 2019-06-14 ENCOUNTER — Ambulatory Visit (HOSPITAL_COMMUNITY): Payer: 59

## 2019-06-17 ENCOUNTER — Telehealth: Payer: Self-pay

## 2019-06-17 NOTE — Telephone Encounter (Signed)
Noted letter mailed to pt.

## 2019-06-17 NOTE — Telephone Encounter (Signed)
Per RMR- Send letter to patient.  Send copy of letter with path to referring provider and PCP.   Send info on UC

## 2019-06-19 ENCOUNTER — Other Ambulatory Visit: Payer: Self-pay

## 2019-06-19 ENCOUNTER — Ambulatory Visit (HOSPITAL_COMMUNITY)
Admission: RE | Admit: 2019-06-19 | Discharge: 2019-06-19 | Disposition: A | Discharge status: Home / Self Care | Payer: 59 | Source: Ambulatory Visit | Attending: Gastroenterology | Admitting: Gastroenterology

## 2019-06-19 DIAGNOSIS — R7989 Other specified abnormal findings of blood chemistry: Secondary | ICD-10-CM | POA: Diagnosis present

## 2019-06-19 NOTE — Progress Notes (Signed)
US reveals entirely normal appearing liver. Gallbladder and CBD also within normal limits.  Alicia: Please let patient know his liver appears normal. No significant findings to explain his elevated LFTs. Considering recently diagnosis of UC and positive ASMA (autoimmune marker), I am concerned for an autoimmune process contributing to his elevated LFTs.   Please verify he is not take any OTC supplements, tylenol, herbal teas, alcohol or other drugs.   I would like to repeat an HFP in 3 weeks to see how his LFTs are trending. Ultimately, we may end up having to send him for a liver biopsy.

## 2019-06-20 ENCOUNTER — Other Ambulatory Visit: Payer: Self-pay | Admitting: *Deleted

## 2019-06-20 DIAGNOSIS — R7989 Other specified abnormal findings of blood chemistry: Secondary | ICD-10-CM

## 2019-06-26 ENCOUNTER — Ambulatory Visit: Payer: 59

## 2019-07-02 ENCOUNTER — Ambulatory Visit: Payer: 59

## 2019-07-03 ENCOUNTER — Ambulatory Visit: Payer: 59 | Attending: Internal Medicine

## 2019-07-03 DIAGNOSIS — Z23 Encounter for immunization: Secondary | ICD-10-CM

## 2019-07-03 NOTE — Progress Notes (Signed)
   Covid-19 Vaccination Clinic  Name:  Carl Reynolds    MRN: [id number] DOB: 07-Apr-1960  07/03/2019  [name] was observed post Covid-19 immunization for 15 minutes without incident. He was provided with Vaccine Information Sheet and instruction to access the V-Safe system.   [name] was instructed to call 911 with any severe reactions post vaccine: Marland Kitchen Difficulty breathing  . Swelling of face and throat  . A fast heartbeat  . A bad rash all over body  . Dizziness and weakness   Immunizations Administered    Name Date Dose VIS Date Route   Moderna COVID-19 Vaccine 07/03/2019 11:44 AM 0.5 mL 02/2019 Intramuscular   Manufacturer: Moderna   Lot: [id number]   [place]: [id number]

## 2019-07-07 NOTE — Progress Notes (Signed)
Referring Provider: Glenda Chroman, MD Primary Care Physician:  Glenda Chroman, MD Primary GI Physician: Dr. Gala Romney  Chief Complaint  Patient presents with  . elevated lft  . Rectal Bleeding    none  . Constipation    reports 2 BM's a day but does not feel cleaned out    HPI:   Carl Reynolds is a 59 y.o. male presenting today for follow-up of diarrhea, rectal bleeding, and elevated LFTs.   Last seen in our office on 05/27/2019 at the time of initial consult for the same.  He reported new onset rectal bleeding a few months prior in setting of diarrhea abdominal cramping.  Abdominal cramping and diarrhea had resolved until receiving the Moderna vaccine which resulted in return of diarrhea which he felt was already improving and was without abdominal pain.  Rectal bleeding had never resolved and occurred every 3-4 days with blood in the stool, toilet water, and on toilet tissue.  Denies family history of colon cancer or IBD.  Hemoglobin 12.6 (L) in February 2021.  Additionally, labs in February revealed alk phos 168 (H), AST 56 (H), ALT 92 (H).  No history of alcohol, drug use, or significant risk factors for hepatitis.  No risk factors for NAFLD.  No OTC supplements.  No personal or family history of autoimmune conditions or liver disease.  He was without signs or symptoms of advanced liver disease. Plans to update CBC, as well as additional laboratory serologies to evaluate elevated LFTs, ultrasound abdomen, proceed with TCS, and follow-up after procedure.  Labs 06/05/2019:  Hemoglobin low at 12.9 but stable.  Ferritin 17 (L) iron 29 (L), percent saturation 7% (L). LFTs increased. Alk phos 177 (H), AST 100 (H), ALT 89 (H). Bilirubin within normal limits.  INR normal.  IgG elevated at 2,329. IgA and IgM within normal limits. ANA and AMA negative.  ASMA 24 (H). Ceruloplasmin within normal limits. No evidence of alpha-1 antitrypsin deficiency.  No evidence of hepatitis B or C. He is  immune to hepatitis A.  Recommended starting oral iron twice daily x8 weeks and rechecking iron panel at that time.  Lab orders were mailed to patient.  Ultrasound 06/19/2019: Liver, spleen, gallbladder within normal limits.  Recommended repeat HFP in 3 weeks. May need to consider liver biopsy in the future.   Colonoscopy 06/07/2019: Diffusely inflamed colon and rectum most consistent with ulcerative colitis/IBD, normal-appearing TI s/p segmental biopsies.  He was started on Entocort 9 mg daily. Pathology revealed chronic mildly active colitis without granulomata, dysplasia, or malignancy.  Findings most likely secondary to UC.  Today: Connected with interpretor via telephone.   UC: Diarrhea has resolved. Now with 2 BMs daily but doesn't feel BMs are complete. Some bleeding that is "normal". Can be in the stool, dropping into the water. States it is very little. Feels this is overall improved. Bleeding every 5-6 days. Diarrhea resolved. Stools are semisolid. Having to push at times with BMs. No abdominal pain. No nausea or vomiting. No joint pain. No NSAIDs. No GERD symptoms or dysphagia.   Only has 3 days left of budesonide.   Elevated LFTs: Goes for labs on 4/29. No swelling in abdomen or LE. No yellowing of eyes or skin. No confusion or change in mental status. No alcohol, drug use, or OTC supplements.   IDA: Has not started taking iron supplement.   Past Medical History:  Diagnosis Date  . 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  Pulmonary tuberculosis 1989   Treated in [place]    Past Surgical History:  Procedure Laterality Date  . BIOPSY  06/07/2019   Procedure: BIOPSY;  Surgeon: [name], MD;  [facility];  Service: Endoscopy;;  . COLONOSCOPY N/A 06/07/2019   Procedure: COLONOSCOPY;  Surgeon: [name], MD; diffusely inflamed colon and rectum most consistent with ulcerative colitis/IBD, normal-appearing TI s/p segmental biopsies.  Pathology with  chronic mildly active colitis without granulomata, dysplasia, or malignancy.  Findings most likely secondary to UC.  Marland Kitchen MOUTH SURGERY  2010   Surgery for mouth cancer    Current Outpatient Medications  Medication Sig Dispense Refill  . budesonide (ENTOCORT EC) 3 MG 24 hr capsule Take 3 capsules (9 mg total) by mouth daily. 90 capsule 0  . fluticasone (FLONASE) 50 MCG/ACT nasal spray Place 1-2 sprays into both nostrils daily as needed for allergies or rhinitis.    . Multiple Vitamin (MULTIVITAMIN WITH MINERALS) TABS tablet Take 1 tablet by mouth daily after lunch. Centrum for Adults 50+    . mesalamine (LIALDA) 1.2 g EC tablet Take 4 tablets (4.8 g total) by mouth daily with breakfast. 120 tablet 3   No current facility-administered medications for this visit.    Allergies as of 07/08/2019  . (No Known Allergies)    Family History  Problem Relation Age of Onset  . Colon cancer Neg Hx   . Inflammatory bowel disease Neg Hx     Social History   Socioeconomic History  . Marital status: Married    Spouse name: Not on file  . Number of children: Not on file  . Years of education: Not on file  . Highest education level: Not on file  Occupational History    Comment: Scientist, water quality at [employer].   Tobacco Use  . Smoking status: Never Smoker  . Smokeless tobacco: Never Used  Substance and Sexual Activity  . Alcohol use: Never  . Drug use: Never  . Sexual activity: Not on file  Other Topics Concern  . Not on file  Social History Narrative  . Not on file   Social Determinants of Health   Financial Resource Strain:   . Difficulty of Paying Living Expenses:   Food Insecurity:   . Worried About Charity fundraiser in the Last Year:   . Arboriculturist in the Last Year:   Transportation Needs:   . Film/video editor (Medical):   Marland Kitchen Lack of Transportation (Non-Medical):   Physical Activity:   . Days of Exercise per Week:   . Minutes of Exercise per Session:   Stress:    . Feeling of Stress :   Social Connections:   . Frequency of Communication with Friends and Family:   . Frequency of Social Gatherings with Friends and Family:   . Attends Religious Services:   . Active Member of Clubs or Organizations:   . Attends Archivist Meetings:   Marland Kitchen Marital Status:     Review of Systems: Gen: Denies fever, chills, lightheadedness, dizziness, presyncope, syncope. CV: Denies chest pain or heart palpitations. Resp: Denies shortness of breath or cough. GI: See HPI. Derm: Denies rash Psych: Denies depression or anxiety. Heme: See HPI  Physical Exam: BP 131/78   Pulse 85   Temp (!) 97.1 F (36.2 C) (Oral)   Ht _0  (1.651 m)   Wt 139 lb 9.6 oz (63.3 kg)   BMI 23.23 kg/m  General:  Alert and oriented. No distress noted. Pleasant and cooperative.  Head:  Normocephalic and atraumatic. Eyes:  Conjuctiva clear without scleral icterus. Heart:  S1, S2 present without murmurs appreciated. Lungs:  Clear to auscultation bilaterally. No wheezes, rales, or rhonchi. No distress.  Abdomen:  +BS, soft, non-tender and non-distended. No rebound or guarding. No HSM or masses noted. Msk:  Symmetrical without gross deformities. Normal posture. Extremities:  Without edema. Neurologic:  Alert and  oriented x4 Psych:  Normal mood and affect.

## 2019-07-08 ENCOUNTER — Other Ambulatory Visit: Payer: Self-pay

## 2019-07-08 ENCOUNTER — Encounter: Payer: Self-pay | Admitting: Gastroenterology

## 2019-07-08 ENCOUNTER — Ambulatory Visit (INDEPENDENT_AMBULATORY_CARE_PROVIDER_SITE_OTHER): Payer: 59 | Admitting: Gastroenterology

## 2019-07-08 ENCOUNTER — Telehealth: Payer: Self-pay | Admitting: Gastroenterology

## 2019-07-08 VITALS — BP 131/78 | HR 85 | Temp 97.1°F | Ht 65.0 in | Wt 139.6 lb

## 2019-07-08 DIAGNOSIS — K625 Hemorrhage of anus and rectum: Secondary | ICD-10-CM

## 2019-07-08 DIAGNOSIS — D509 Iron deficiency anemia, unspecified: Secondary | ICD-10-CM | POA: Diagnosis not present

## 2019-07-08 DIAGNOSIS — K51011 Ulcerative (chronic) pancolitis with rectal bleeding: Secondary | ICD-10-CM | POA: Diagnosis not present

## 2019-07-08 DIAGNOSIS — R7989 Other specified abnormal findings of blood chemistry: Secondary | ICD-10-CM

## 2019-07-08 DIAGNOSIS — K5190 Ulcerative colitis, unspecified, without complications: Secondary | ICD-10-CM | POA: Insufficient documentation

## 2019-07-08 MED ORDER — MESALAMINE 1.2 G PO TBEC: 4.8000 g | DELAYED_RELEASE_TABLET | Freq: Every day | ORAL | 3 refills | Status: DC

## 2019-07-08 MED ORDER — BUDESONIDE 3 MG PO CPEP: 9.0000 mg | ORAL_CAPSULE | Freq: Every day | ORAL | 0 refills | Status: DC

## 2019-07-08 NOTE — Assessment & Plan Note (Signed)
Addressed under ulcerative colitis. 

## 2019-07-08 NOTE — Assessment & Plan Note (Addendum)
LFTs found to be elevated in February 2021.  Alk phos 168 (H), AST 56 (H), ALT 92 (H).  Total bilirubin normal.  CBC at that time with normal platelets. Repeat labs with LFTs increasing on 06/05/2019.  Alk phos 177, AST 100, ALT 89.  Bilirubin within normal limits.  INR within normal limits.  IgG elevated at 2329 and ASMA elevated at 24.  Otherwise, IgA and IgM within normal limits.  Ceruloplasmin within normal limits.  No alpha-1 antitrypsin deficiency.  ANA and AMA negative.  No evidence of hepatitis B or C.  No immunity to hepatitis B.  He is immune to hepatitis A.  Ultrasound on 06/19/2019 with normal-appearing liver, spleen, and gallbladder.  He denies any history of alcohol or drug use.  No OTC supplements.  No regular Tylenol use.  No family history of autoimmune conditions or liver disease.  Of note, patient was recently diagnosed with ulcerative colitis via colonoscopy completed 06/07/2019.    Etiology of elevated LFTs is not clear.  Concern for autoimmune process considering elevated IgG and ASMA and recent diagnosis of ulcerative colitis. He is currently due for repeat HFP this week.  Discussed that we may need to pursue liver biopsy should his LFTs remain elevated/continue to rise.  Patient expresses understanding and was agreeable to liver biopsy if this was needed.Further recommendations to follow HFP. Recommended he have hepatitis B vaccine completed with PCP.  

## 2019-07-08 NOTE — Patient Instructions (Addendum)
Continue taking budesonide 9 mg daily for the next 4 weeks.  Start taking Lialda 4.8 g (4 tablets total) daily for ulcerative colitis.  Have your labs completed this week to update your liver enzymes.  We will call you with results and recommendations.  Start over-the-counter iron supplement containing 325 mg ferrous sulfate twice daily for the next 8 weeks.  We will update your labs in 8 weeks to check your hemoglobin and iron panel.  For incomplete bowel movements: Add a daily fiber supplement.  Benefiber or Metamucil or good options. Ensure you are drinking plenty of water daily.  You should drink enough to keep your urine pale yellow to clear. Be sure you are consuming plenty of fruits and vegetables daily. If needed, you may use MiraLAX 1 capful in 8 ounces of water daily.  Decrease frequency if you develop frequent loose stools.  You need vaccination for hepatitis B.  Please have this completed with your primary care provider.  We will follow up with you in the office in 4 weeks.  Aliene Altes, PA-C [facility]

## 2019-07-08 NOTE — Assessment & Plan Note (Addendum)
Patient recently diagnosed with ulcerative colitis via colonoscopy on 06/07/2019.  Symptoms prior to colonoscopy included rectal bleeding, diarrhea, and abdominal cramping although abdominal cramping had resolved prior to TCS.  Colonoscopy revealed diffusely inflamed colon and rectum most consistent with ulcerative colitis/IBD, normal-appearing TI s/p biopsies.  Pathology revealed chronic mildly active colitis without granulomata, dysplasia, or malignancy.  Findings most likely secondary to UC.  He was started on Entocort 9 mg daily.  Clinically, patient feels he is improving.  He does continue with rectal bleeding now occurring every 5-6 days which patient reports is "very little"  in quantity.  No abdominal pain.  Diarrhea has resolved.  Now with 2 semisolid BMs daily that are not complete and require straining at times.  No abdominal pain.  Denies joint pain.  No NSAID use.    Due to pancolitis, patient is going to need chronic maintenance therapy.  Although he has improved, he has not reached clinical remission.  We will continue budesonide 9 mg for an additional 4 weeks.  We will also start Lialda 4.8 mg daily.  Recent labs in March with normal renal function.  We will need to update renal function at next visit.   To help with bowel regularity:  Recommended Benefiber or Metamucil daily. Drink enough water to keep urine pale yellow to clear. Consume plenty of fruits and vegetables daily. If needed, use MiraLAX 1 capful (17g) in 8 ounces of water daily.  Decrease frequency if he develops loose stools.  Follow-up in 4 weeks.  Will request to have interpreter present at next visit as communication over the phone was somewhat difficult today.

## 2019-07-08 NOTE — Telephone Encounter (Signed)
Carl Reynolds, can we request for an interpreter to be present at patient's next visit?

## 2019-07-08 NOTE — Assessment & Plan Note (Addendum)
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  Labs on 06/05/2019 with hemoglobin 12.9 (L), ferritin 17 (L) iron 29 (L), percent saturation 7% (L).  Since his colonoscopy, he has been on budesonide 9 mg daily and has almost completed 4 weeks of therapy.  Rectal bleeding has improved but does occur about every 5-6 days which patient reports is "very little" in quantity.  He has not yet started iron supplement as directed after lab results in March.  Start OTC iron supplement containing 325 mg ferrous sulfate twice daily. Recheck CBC and iron panel in 8 weeks. Further management of ulcerative colitis discussed above. Follow-up in 4 weeks for UC.

## 2019-07-09 ENCOUNTER — Encounter: Payer: Self-pay | Admitting: Internal Medicine

## 2019-07-09 NOTE — Telephone Encounter (Signed)
Noted  

## 2019-07-09 NOTE — Telephone Encounter (Signed)
It is marked for interpreter, but with his language it may still be over the telephone.  It will depend if they have one for in person.  But it is flagged.

## 2019-07-10 ENCOUNTER — Other Ambulatory Visit (HOSPITAL_COMMUNITY)
Admission: RE | Admit: 2019-07-10 | Discharge: 2019-07-10 | Disposition: A | Discharge status: Home / Self Care | Payer: 59 | Source: Ambulatory Visit | Attending: Gastroenterology | Admitting: Gastroenterology

## 2019-07-10 DIAGNOSIS — R7989 Other specified abnormal findings of blood chemistry: Secondary | ICD-10-CM | POA: Diagnosis present

## 2019-07-10 LAB — HEPATIC FUNCTION PANEL
ALT: 106 U/L — ABNORMAL HIGH (ref 0–44)
AST: 58 U/L — ABNORMAL HIGH (ref 15–41)
Albumin: 3.8 g/dL (ref 3.5–5.0)
Alkaline Phosphatase: 149 U/L — ABNORMAL HIGH (ref 38–126)
Bilirubin, Direct: 0.1 mg/dL (ref 0.0–0.2)
Indirect Bilirubin: 0.8 mg/dL (ref 0.3–0.9)
Total Bilirubin: 0.9 mg/dL (ref 0.3–1.2)
Total Protein: 7.4 g/dL (ref 6.5–8.1)

## 2019-07-11 NOTE — Progress Notes (Signed)
LFTs remain elevated. R value 2, previously 1.5, consistent with cholestatic injury. Considering recent UC diagnosis, would like to pursue MRI/MRCP to evaluate bile ducts to check for possible PSC. If this is unremarkable, we will move forward with liver biopsy.   Alicia:  Please let patient know LFTs remain elevated. Would like to arrange MRI/MRCP to evaluate his bile ducts and for something called primary sclerosing cholangitis (a condition that causes inflammation in his bile ducts and can be associated with UC). If this is unremarkable, we will proceed with liver biopsy.   [facility] Clinical Pool:  If patient is agreeable, please arrange MRI/MRCP. Dx. Elevated LFTs with cholestatic pattern, ulcerative colitis. Ideally, would like for this to be completed in the next 4-6 weeks.

## 2019-07-12 ENCOUNTER — Other Ambulatory Visit: Payer: Self-pay

## 2019-07-12 DIAGNOSIS — R7989 Other specified abnormal findings of blood chemistry: Secondary | ICD-10-CM

## 2019-07-16 ENCOUNTER — Encounter: Payer: Self-pay | Admitting: Emergency Medicine

## 2019-07-16 ENCOUNTER — Other Ambulatory Visit: Payer: Self-pay | Admitting: Emergency Medicine

## 2019-07-16 DIAGNOSIS — R7989 Other specified abnormal findings of blood chemistry: Secondary | ICD-10-CM

## 2019-07-16 DIAGNOSIS — D509 Iron deficiency anemia, unspecified: Secondary | ICD-10-CM

## 2019-08-07 ENCOUNTER — Ambulatory Visit (HOSPITAL_COMMUNITY): Payer: 59

## 2019-08-07 NOTE — Progress Notes (Signed)
Referring Provider: Glenda Chroman, MD Primary Care Physician:  Glenda Chroman, MD Primary GI Physician: Dr. Gala Romney  Chief Complaint  Patient presents with  . Follow-up  . Rectal Bleeding    resolved     HPI:   Carl Reynolds is a 59 y.o. 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 Also with elevated elevated LFTs with no history of alcohol or drug use, no OTC supplements, no significant risk factors for NAFLD. Extensive evaluation of elevated LFTs thus far with INR within normal limits,  IgG elevated at 2329 and ASMA elevated at 24.  Otherwise, IgA and IgM within normal limits.  Ceruloplasmin within normal limits.  No alpha-1 antitrypsin deficiency.  ANA and AMA negative.  No evidence of hepatitis B or C.  No immunity to hepatitis B.  He is immune to hepatitis A.  Ultrasound in April 2021 with normal-appearing liver and spleen.    He presents today for follow-up. Last seen in our office 07/08/2019 for follow-up in UC and elevated LFTs as well.  He had completed 4 weeks of Entocort 9 mg daily.  Diarrhea had resolved.  He was having 2 semisolid BMs daily but felt they were incomplete and required straining.  Improvement in rectal bleeding which was occurring every 5-6 days and was little in quantity.  No abdominal pain.  He had not started oral iron as previously recommended for IDA.  No signs or symptoms of advanced liver disease.  As patient continued with intermittent rectal bleeding, plan to continue Entocort 9 mg for an additional 4 weeks as well as start Lialda 4.8 mg daily.  To help with incomplete bowel movements, he has advised to add daily fiber supplement and use MiraLAX 1 capful daily if needed.  For IDA, he was to start oral iron twice daily with repeat CBC and iron panel in 8 weeks.  For elevated LFTs, he was to repeat HFP with further recommendations to follow.  Labs 07/10/2019: AST 58, ALT 106, alk phos  149. value 2, previously 1.5, consistent with cholestatic injury. Considering recent UC diagnosis, planned for MRI/MRCP to evaluate bile ducts to check for possible PSC. If this is unremarkable, may consider liver biopsy. MRI was scheduled for 5/26 but was not completed.   Today:   UC: Has 3 days left of budesonide.  He is taking Lialda as prescribed.  Rectal bleeding has resolved.  No diarrhea or abdominal pain. BMs daily.  States he is feeling much better.  Has gained some weight.  No joint pain.  No NSAIDs.    No GERD symptoms, nausea, vomiting, or dysphagia.  IDA: Planning to repeat CBC/iron panel in 4 weeks. Taking iron twice a day.  No melena.  No dizziness or lightheadedness.   Elevated LFTs: No swelling in LE or abdomen. No confusion, bruising, or jaundice. Has MRI scheduled for June 2nd.  He is requesting a call his son with MRI results.  Son's number is in the chart.   Past Medical History:  Diagnosis Date  . 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 Pulmonary tuberculosis 1989   Treated in [place]  . Ulcerative colitis [facility])     Past Surgical History:  Procedure Laterality Date  . BIOPSY  06/07/2019   Procedure: BIOPSY;  Surgeon: [name], MD;  [facility];  Service: Endoscopy;;  . COLONOSCOPY N/A 06/07/2019   Procedure: COLONOSCOPY;  Surgeon: [name], MD; diffusely inflamed colon and rectum most consistent with ulcerative colitis/IBD, normal-appearing TI s/p segmental biopsies.  Pathology with chronic mildly active colitis without granulomata, dysplasia, or malignancy.  Findings most likely secondary to UC.  Marland Kitchen MOUTH SURGERY  2010   Surgery for mouth cancer    Current Outpatient Medications  Medication Sig Dispense Refill  . budesonide (ENTOCORT EC) 3 MG 24 hr capsule Take 3 capsules (9 mg total) by mouth daily. 90 capsule 0  . fluticasone (FLONASE) 50 MCG/ACT nasal spray Place 1-2 sprays into both nostrils daily as needed for allergies or  rhinitis.    Marland Kitchen mesalamine (LIALDA) 1.2 g EC tablet Take 4 tablets (4.8 g total) by mouth daily with breakfast. 120 tablet 3  . Multiple Vitamin (MULTIVITAMIN WITH MINERALS) TABS tablet Take 1 tablet by mouth daily after lunch. Centrum for Adults 50+     No current facility-administered medications for this visit.    Allergies as of 08/08/2019  . (No Known Allergies)    Family History  Problem Relation Age of Onset  . Colon cancer Neg Hx   . Inflammatory bowel disease Neg Hx   . Liver disease Neg Hx     Social History   Socioeconomic History  . Marital status: Married    Spouse name: Not on file  . Number of children: Not on file  . Years of education: Not on file  . Highest education level: Not on file  Occupational History    Comment: Scientist, water quality at [employer].   Tobacco Use  . Smoking status: Never Smoker  . Smokeless tobacco: Never Used  Substance and Sexual Activity  . Alcohol use: Never  . Drug use: Never  . Sexual activity: Not on file  Other Topics Concern  . Not on file  Social History Narrative  . Not on file   Social Determinants of Health   Financial Resource Strain:   . Difficulty of Paying Living Expenses:   Food Insecurity:   . Worried About Charity fundraiser in the Last Year:   . Arboriculturist in the Last Year:   Transportation Needs:   . Film/video editor (Medical):   Marland Kitchen Lack of Transportation (Non-Medical):   Physical Activity:   . Days of Exercise per Week:   . Minutes of Exercise per Session:   Stress:   . Feeling of Stress :   Social Connections:   . Frequency of Communication with Friends and Family:   . Frequency of Social Gatherings with Friends and Family:   . Attends Religious Services:   . Active Member of Clubs or Organizations:   . Attends Archivist Meetings:   Marland Kitchen Marital Status:     Review of Systems: Gen: Denies fever, chills, lightheadedness, dizziness, presyncope, syncope CV: Denies chest pain  or palpitations Resp: Denies dyspnea or cough GI: See HPI Derm: Denies rash Psych: Denies depression or anxiety Heme: See HPI  Physical Exam: BP 127/82   Pulse 74   Temp (!) 97.3 F (36.3 C) (Oral)   Ht _0  (1.651 m)   Wt 145 lb 12.8 oz (66.1 kg)   BMI 24.26 kg/m  General:   Alert and oriented. No distress noted. Pleasant and cooperative.  Head:  Normocephalic and atraumatic. Eyes:  Conjuctiva clear without scleral icterus. Heart:  S1, S2 present without murmurs appreciated. Lungs:  Clear to auscultation bilaterally. No wheezes, rales, or rhonchi. No distress.  Abdomen:  +  BS, soft, non-tender and non-distended. No rebound or guarding. No HSM or masses noted. Msk:  Symmetrical without gross deformities. Normal posture. Extremities:  Without edema. Neurologic:  Alert and  oriented x4 Psych:  Normal mood and affect.

## 2019-08-08 ENCOUNTER — Other Ambulatory Visit: Payer: Self-pay

## 2019-08-08 ENCOUNTER — Ambulatory Visit (INDEPENDENT_AMBULATORY_CARE_PROVIDER_SITE_OTHER): Payer: 59 | Admitting: Gastroenterology

## 2019-08-08 ENCOUNTER — Encounter: Payer: Self-pay | Admitting: Gastroenterology

## 2019-08-08 VITALS — BP 127/82 | HR 74 | Temp 97.3°F | Ht 65.0 in | Wt 145.8 lb

## 2019-08-08 DIAGNOSIS — K51011 Ulcerative (chronic) pancolitis with rectal bleeding: Secondary | ICD-10-CM

## 2019-08-08 DIAGNOSIS — R7989 Other specified abnormal findings of blood chemistry: Secondary | ICD-10-CM

## 2019-08-08 DIAGNOSIS — D509 Iron deficiency anemia, unspecified: Secondary | ICD-10-CM

## 2019-08-08 NOTE — Patient Instructions (Addendum)
Go ahead and complete your last 3 days of Entocort 9 mg daily.  We will stop this medication after you complete your current course.  Continue Lialda 4 tablets (4.8 g total) daily.  Please have your labs completed in 4 weeks.  This is update your hemoglobin, check your iron, kidney function, and liver function test.  Keep your appointment to have your MRI completed on 6/2.  At your request, I will call your son with results.  Discuss having your pneumonia vaccine with Dr. Woody Seller.   We will plan to follow-up with you in the office in 3 months.  Call with questions or concerns prior.  Aliene Altes, PA-C [facility]

## 2019-08-08 NOTE — Assessment & Plan Note (Addendum)
Elevated LFTs noted in February 2021 with alk phos 168, AST 56, ALT 92, total bilirubin within normal limits.  CBC with normal platelets.  Extensive evaluation thus far including INR within normal limits. IgG elevated at 2329 and ASMA elevated at 24.  Otherwise, IgA and IgM within normal limits.  Ceruloplasmin within normal limits.  No alpha-1 antitrypsin deficiency.  ANA and AMA negative.  No evidence of hepatitis B or C.  No immunity to hepatitis B.  Immunity to hepatitis A.  Ultrasound on 06/19/2019 with normal-appearing liver, spleen, and gallbladder.  No history of alcohol or drug use.  No OTC supplements.  No regular Tylenol use.  No family history of autoimmune conditions or liver disease.  Notably, patient was recently diagnosed with ulcerative colitis in March 2021.  Most recent LFTs 07/10/2019 with AST 58, ALT 106, alk phos 149 with R value 2, consistent with cholestatic injury.  He continues to be without signs or symptoms of advanced liver disease.    I have recommended for him to complete an MRI/MRCP to evaluate his bile ducts and check for possible PBC in the setting of ulcerative colitis.  MRI was originally scheduled for 5/26.  Patient states he has rescheduled this to 6/2.  Plan to recheck HFP in 4 weeks.  If MRI is unremarkable and his LFTs continue to remain elevated, would need to consider liver biopsy as I have explanation for his elevated LFTs.

## 2019-08-08 NOTE — Assessment & Plan Note (Signed)
Mild IDA with hemoglobin 12.9 and ferritin 17 in March 2021 currently taking oral iron twice daily.  This is likely secondary to rectal bleeding with newly diagnosed pan ulcerative colitis in March 2021 via colonoscopy.  He has had resolution of rectal bleeding and is clinically doing very well from an ulcerative colitis standpoint.  No significant upper GI symptoms.  No melena.  No NSAIDs.  Plan to update CBC and iron panel in 4 weeks (this will follow 8 weeks of oral iron).  Follow-up in 3 months.

## 2019-08-08 NOTE — Assessment & Plan Note (Signed)
59 year old male recently diagnosed with ulcerative colitis in March 2021 with associated diarrhea, rectal bleeding, and mild IDA with hemoglobin 12.9 and ferritin 17.  He has 3 days left of an 8-week course of budesonide and has completed 4 weeks of Lialda 4.8 g daily with complete resolution of rectal bleeding and diarrhea.  No abdominal pain, joint pain, or any other significant upper or lower GI symptoms.  No NSAID use.  Complete last 3 days of budesonide then stop. Continue Lialda 4.8 g daily. Advised to discuss pneumonia vaccine with Dr. Woody Seller. He is currently taking iron twice daily for IDA with plans to recheck CBC and iron panel in 4 weeks.  I am also rechecking his kidney function at that time. Follow-up in 3 months.  Call with questions or concerns prior.

## 2019-08-14 ENCOUNTER — Ambulatory Visit (HOSPITAL_COMMUNITY)
Admission: RE | Admit: 2019-08-14 | Discharge: 2019-08-14 | Disposition: A | Discharge status: Home / Self Care | Payer: 59 | Source: Ambulatory Visit | Attending: Gastroenterology | Admitting: Gastroenterology

## 2019-08-14 ENCOUNTER — Other Ambulatory Visit: Payer: Self-pay

## 2019-08-14 ENCOUNTER — Other Ambulatory Visit: Payer: Self-pay | Admitting: Gastroenterology

## 2019-08-14 DIAGNOSIS — R7989 Other specified abnormal findings of blood chemistry: Secondary | ICD-10-CM | POA: Diagnosis not present

## 2019-08-14 MED ORDER — GADOBUTROL 1 MMOL/ML IV SOLN
7.0000 mL | Freq: Once | INTRAVENOUS | Status: AC | PRN
  Administered 2019-08-14: 7 mL via INTRAVENOUS

## 2019-08-16 NOTE — Progress Notes (Signed)
Liver and bile ducts within normal limits. Sludge in the gallbladder without evidence of inflammation/infection. Will recheck LFTs as planned. Patient should be going to have labs completed around 6/24. Further recommendations to follow.   Please note patient requested we call his son, Momen Ham, with the results. Number is in chart.

## 2019-09-04 ENCOUNTER — Other Ambulatory Visit: Payer: Self-pay

## 2019-09-04 ENCOUNTER — Other Ambulatory Visit (HOSPITAL_COMMUNITY)
Admission: RE | Admit: 2019-09-04 | Discharge: 2019-09-04 | Disposition: A | Discharge status: Home / Self Care | Payer: 59 | Source: Ambulatory Visit | Attending: Gastroenterology | Admitting: Gastroenterology

## 2019-09-04 DIAGNOSIS — R7989 Other specified abnormal findings of blood chemistry: Secondary | ICD-10-CM | POA: Diagnosis present

## 2019-09-04 DIAGNOSIS — D509 Iron deficiency anemia, unspecified: Secondary | ICD-10-CM | POA: Insufficient documentation

## 2019-09-04 LAB — IRON AND TIBC
Iron: 69 ug/dL (ref 45–182)
Saturation Ratios: 15 % — ABNORMAL LOW (ref 17.9–39.5)
TIBC: 455 ug/dL — ABNORMAL HIGH (ref 250–450)
UIBC: 386 ug/dL

## 2019-09-04 LAB — CBC WITH DIFFERENTIAL/PLATELET
Abs Immature Granulocytes: 0.02 10*3/uL (ref 0.00–0.07)
Basophils Absolute: 0.1 10*3/uL (ref 0.0–0.1)
Basophils Relative: 1 %
Eosinophils Absolute: 0.4 10*3/uL (ref 0.0–0.5)
Eosinophils Relative: 5 %
HCT: 41 % (ref 39.0–52.0)
Hemoglobin: 13.5 g/dL (ref 13.0–17.0)
Immature Granulocytes: 0 %
Lymphocytes Relative: 33 %
Lymphs Abs: 2.6 10*3/uL (ref 0.7–4.0)
MCH: 29 pg (ref 26.0–34.0)
MCHC: 32.9 g/dL (ref 30.0–36.0)
MCV: 88 fL (ref 80.0–100.0)
Monocytes Absolute: 0.5 10*3/uL (ref 0.1–1.0)
Monocytes Relative: 6 %
Neutro Abs: 4.2 10*3/uL (ref 1.7–7.7)
Neutrophils Relative %: 55 %
Platelets: 321 10*3/uL (ref 150–400)
RBC: 4.66 MIL/uL (ref 4.22–5.81)
RDW: 13.9 % (ref 11.5–15.5)
WBC: 7.8 10*3/uL (ref 4.0–10.5)
nRBC: 0 % (ref 0.0–0.2)

## 2019-09-04 LAB — HEPATIC FUNCTION PANEL
ALT: 133 U/L — ABNORMAL HIGH (ref 0–44)
AST: 154 U/L — ABNORMAL HIGH (ref 15–41)
Albumin: 3.9 g/dL (ref 3.5–5.0)
Alkaline Phosphatase: 130 U/L — ABNORMAL HIGH (ref 38–126)
Bilirubin, Direct: 0.2 mg/dL (ref 0.0–0.2)
Indirect Bilirubin: 0.9 mg/dL (ref 0.3–0.9)
Total Bilirubin: 1.1 mg/dL (ref 0.3–1.2)
Total Protein: 7.6 g/dL (ref 6.5–8.1)

## 2019-09-04 LAB — BASIC METABOLIC PANEL
Anion gap: 8 (ref 5–15)
BUN: 7 mg/dL (ref 6–20)
CO2: 26 mmol/L (ref 22–32)
Calcium: 8.9 mg/dL (ref 8.9–10.3)
Chloride: 101 mmol/L (ref 98–111)
Creatinine, Ser: 0.88 mg/dL (ref 0.61–1.24)
GFR calc Af Amer: >60 mL/min (ref >60)
GFR calc non Af Amer: >60 mL/min (ref >60)
Glucose, Bld: 97 mg/dL (ref 70–99)
Potassium: 3.7 mmol/L (ref 3.5–5.1)
Sodium: 135 mmol/L (ref 135–145)

## 2019-09-04 LAB — FERRITIN: Ferritin: 17 ng/mL — ABNORMAL LOW (ref 24–336)

## 2019-09-06 ENCOUNTER — Other Ambulatory Visit: Payer: Self-pay

## 2019-09-06 DIAGNOSIS — R7989 Other specified abnormal findings of blood chemistry: Secondary | ICD-10-CM

## 2019-09-06 NOTE — Progress Notes (Signed)
Iron panel remains low but slightly improved. He will need to continue on oral iron BID for now. Hemoglobin improved to 13.5 (within normal limits). Kidney function remains stable.   LFTs remain elevated and have actually increased again. As his MRI was unrevealing and LFTs continue to remain elevated without explanation after extensive evaluation, I recommend we proceed with liver biopsy as we had discussed previously.   [facility] Clinical Pool: Please arrange for liver biopsy.

## 2019-10-07 ENCOUNTER — Telehealth: Payer: Self-pay

## 2019-10-07 NOTE — Telephone Encounter (Signed)
Liver biospy hasn't been scheduled. [employer], spoke to Rainier to f/u on order. She stated pt had been called, wife answered, pt wasn't available and they haven't heard back from pt to schedule.

## 2019-10-25 ENCOUNTER — Encounter: Payer: Self-pay | Admitting: *Deleted

## 2019-10-25 NOTE — Telephone Encounter (Signed)
Liver biopsy still has not been scheduled. Called [employer] and spoke with Marianna Fuss. She is going to send a message to Tosha to f/u on order and have her call us with an update.

## 2019-10-25 NOTE — Telephone Encounter (Signed)
Carl Reynolds called back. She called patient on 6/28, spouse with spouse and she was to have patient called back. She called again on 6/29 and no answer. Patient did not call back.  I called pt home # and was advised by a male patient was not there and phone was hung up I called mobile # and LMOVM  FYI to [facility]

## 2019-10-25 NOTE — Telephone Encounter (Signed)
Noted  

## 2019-10-25 NOTE — Telephone Encounter (Signed)
When patient signed release of information he only put spouse on it. I am unable to reach patient to get verbal permission to speak with son.

## 2019-10-25 NOTE — Telephone Encounter (Signed)
Letter mailed to patient.

## 2019-10-25 NOTE — Telephone Encounter (Signed)
Noted. Have you tried to call patient's son? Patient has previously asked that we call his son.

## 2019-11-05 ENCOUNTER — Telehealth: Payer: Self-pay | Admitting: Internal Medicine

## 2019-11-05 NOTE — Telephone Encounter (Signed)
[phone]  Please call patient about the liver biopsy, needs to know why it is being done

## 2019-11-05 NOTE — Telephone Encounter (Signed)
Error

## 2019-11-06 NOTE — Telephone Encounter (Signed)
Aliene Altes, PA, please advise.

## 2019-11-06 NOTE — Telephone Encounter (Signed)
Lmom, waiting on a return call.  

## 2019-11-06 NOTE — Telephone Encounter (Signed)
Liver biopsy has been ordered due to elevated LFTs.  We have completed an extensive evaluation with no identified cause.  Please see result note dated 09/04/2019.

## 2019-11-07 NOTE — Progress Notes (Signed)
Referring Provider: Glenda Chroman, MD Primary Care Physician:  Glenda Chroman, MD Primary GI Physician: Dr. Gala Romney  Chief Complaint  Patient presents with  . Follow-up    fu UC    HPI:   Carl Reynolds is a 59 y.o. male presenting today for follow-up on UC and elevated LFTs.  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  Also noted to have elevated LFTs with no history of alcohol or drug use, no OTC supplements, no significant risk factors for NAFLD.  He had extensive evaluation of elevated LFTs with  INR within normal limits, IgG elevated at 2329 and ASMA elevated at 24. Otherwise, IgA and IgM within normal limits. Ceruloplasmin within normal limits. No alpha-1 antitrypsin deficiency. ANA and AMA negative. No evidence of hepatitis B or C. No immunity to hepatitis B. He is immune to hepatitis A.  Ultrasound in April 2021 with normal-appearing liver and spleen.    MRI/MRCP 08/14/2019 with gallbladder sludge without acute cholecystitis or biliary ductal dilation, no hepatic steatosis or mass.  He was last seen in our office 08/08/2019.  He had 3 days left of budesonide and was taking Lialda 4.8 g daily.  Rectal bleeding, diarrhea, abdominal pain resolved.  He was taking iron twice daily.  No signs or symptoms of advanced liver disease.  Plan to complete current course of budesonide, otherwise continue current medications and update labs in 4 weeks.  Advised to discuss pneumonia vaccine with PCP.  Labs updated 09/04/2019.  Hemoglobin 13.5 with normocytic indices, ferritin 17 (L), iron 69, saturation 15% (L).  BMP within normal limits.  AST 154 (H), ALT 133 (H), alk phos 130 (H), bilirubin 1.1.  LFTs had increased. (Prior labs in April 2021 with AST 58, ALT 106, alk phos 149).  Recommended liver biopsy.  Liver biopsy has not been scheduled.  [place] Scheduling has been trying to contact patient but has been  unsuccessful.  Patient called our office asking whether liver biopsy had to be done.  We tried to call patient and left voicemail on 11/06/2019.  Today: Patient called son on his phone to have him be a part of the office visit.  Patient requested that we call his son regarding appointments or results.  Son's name is Ellis Mehaffey and can be reached at [phone].  Little blood in the stool a couple weeks ago. Drank milk and had loose stools last week. Stools were watery. 4-5 per day.  For the last 3 days, he has had 2 formed stools per day. No black stools. No abdominal pain this week.  No rectal bleeding this week.  No nausea or vomiting. No lightheadedness or dizziness.  No fever or chills.  No swelling in lower extremities or abdomen, easy bruising, confusion, or yellowing of the eyes.    Last took [place] after his last visit with me (3 months ago).  He is not taking iron. No NSAIDs. No alcohol, over-the-counter supplements (other than multivitamin), or tobacco use.  Past Medical History:  Diagnosis Date  . 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 Pulmonary tuberculosis 1989   Treated in [place]  . Ulcerative colitis [facility])     Past Surgical History:  Procedure Laterality Date  . BIOPSY  06/07/2019   Procedure: BIOPSY;  Surgeon: [name], MD;  Location: [facility];  Service: Endoscopy;;  . COLONOSCOPY N/A 06/07/2019   Procedure:  COLONOSCOPY;  Surgeon: [name], MD; diffusely inflamed colon and rectum most consistent with ulcerative colitis/IBD, normal-appearing TI s/p segmental biopsies.  Pathology with chronic mildly active colitis without granulomata, dysplasia, or malignancy.  Findings most likely secondary to UC.  Marland Kitchen MOUTH SURGERY  2010   Surgery for mouth cancer    Current Outpatient Medications  Medication Sig Dispense Refill  . Multiple Vitamin (MULTIVITAMIN WITH MINERALS) TABS tablet Take 1 tablet by mouth daily after lunch. Centrum for Adults 50+    .  ferrous sulfate 325 (65 FE) MG tablet Take 1 tablet (325 mg total) by mouth daily. 60 tablet 3  . fluticasone (FLONASE) 50 MCG/ACT nasal spray Place 1-2 sprays into both nostrils daily as needed for allergies or rhinitis. (Patient not taking: Reported on 11/08/2019)    . mesalamine (LIALDA) 1.2 g EC tablet Take 4 tablets (4.8 g total) by mouth daily with breakfast. 120 tablet 3   No current facility-administered medications for this visit.    Allergies as of 11/08/2019  . (No Known Allergies)    Family History  Problem Relation Age of Onset  . Colon cancer Neg Hx   . Inflammatory bowel disease Neg Hx   . Liver disease Neg Hx     Social History   Socioeconomic History  . Marital status: Married    Spouse name: Not on file  . Number of children: Not on file  . Years of education: Not on file  . Highest education level: Not on file  Occupational History    Comment: Scientist, water quality at [employer].   Tobacco Use  . Smoking status: Never Smoker  . Smokeless tobacco: Never Used  Substance and Sexual Activity  . Alcohol use: Never  . Drug use: Never  . Sexual activity: Not on file  Other Topics Concern  . Not on file  Social History Narrative  . Not on file   Social Determinants of Health   Financial Resource Strain:   . Difficulty of Paying Living Expenses: Not on file  Food Insecurity:   . Worried About Charity fundraiser in the Last Year: Not on file  . Ran Out of Food in the Last Year: Not on file  Transportation Needs:   . Lack of Transportation (Medical): Not on file  . Lack of Transportation (Non-Medical): Not on file  Physical Activity:   . Days of Exercise per Week: Not on file  . Minutes of Exercise per Session: Not on file  Stress:   . Feeling of Stress : Not on file  Social Connections:   . Frequency of Communication with Friends and Family: Not on file  . Frequency of Social Gatherings with Friends and Family: Not on file  . Attends Religious  Services: Not on file  . Active Member of Clubs or Organizations: Not on file  . Attends Archivist Meetings: Not on file  . Marital Status: Not on file    Review of Systems: Gen: Denies fever, chills, cold or flulike symptoms, presyncope, syncope. CV: Denies chest pain or palpitations Resp: Denies dyspnea or cough. GI: See HPI Derm: Denies rash Heme: See HPI.  Physical Exam: BP 139/88   Pulse 81   Temp (!) 97.1 F (36.2 C) (Temporal)   Ht 5' 5"  (1.651 m)   Wt 144 lb 12.8 oz (65.7 kg)   BMI 24.10 kg/m  General:   Alert and oriented. No distress noted. Pleasant and cooperative.  Head:  Normocephalic and  atraumatic. Eyes:  Conjuctiva clear without scleral icterus. Heart:  S1, S2 present without murmurs appreciated. Lungs:  Clear to auscultation bilaterally. No wheezes, rales, or rhonchi. No distress.  Abdomen:  +BS, soft, non-tender and non-distended. No rebound or guarding. No HSM or masses noted. Msk:  Symmetrical without gross deformities. Normal posture. Extremities:  Without edema. Neurologic:  Alert and  oriented x4 Psych: Normal mood and affect.

## 2019-11-08 ENCOUNTER — Ambulatory Visit (INDEPENDENT_AMBULATORY_CARE_PROVIDER_SITE_OTHER): Payer: 59 | Admitting: Gastroenterology

## 2019-11-08 ENCOUNTER — Other Ambulatory Visit: Payer: Self-pay

## 2019-11-08 ENCOUNTER — Encounter: Payer: Self-pay | Admitting: Internal Medicine

## 2019-11-08 ENCOUNTER — Encounter: Payer: Self-pay | Admitting: Gastroenterology

## 2019-11-08 VITALS — BP 139/88 | HR 81 | Temp 97.1°F | Ht 65.0 in | Wt 144.8 lb

## 2019-11-08 DIAGNOSIS — K51011 Ulcerative (chronic) pancolitis with rectal bleeding: Secondary | ICD-10-CM | POA: Diagnosis not present

## 2019-11-08 DIAGNOSIS — D509 Iron deficiency anemia, unspecified: Secondary | ICD-10-CM

## 2019-11-08 DIAGNOSIS — R7989 Other specified abnormal findings of blood chemistry: Secondary | ICD-10-CM | POA: Diagnosis not present

## 2019-11-08 MED ORDER — FERROUS SULFATE 325 (65 FE) MG PO TABS: 325.0000 mg | ORAL_TABLET | Freq: Every day | ORAL | 3 refills | Status: DC

## 2019-11-08 MED ORDER — MESALAMINE 1.2 G PO TBEC: 4.8000 g | DELAYED_RELEASE_TABLET | Freq: Every day | ORAL | 3 refills | Status: DC

## 2019-11-08 NOTE — Assessment & Plan Note (Signed)
Addressed under ulcerative colitis.

## 2019-11-08 NOTE — Assessment & Plan Note (Signed)
Patient was noted to have elevated LFTs in February 2021.  He has had extensive evaluation thus far including INR within normal limits, IgG elevated 2329 and ASMA elevated at 24.  Otherwise IgA and IgM within normal limits.  Ceruloplasmin within normal limits.  No alpha-1 antitrypsin deficiency.  ANA and AMA negative.  No evidence of hepatitis B or C.  No immunity to hepatitis B.  Immune to hepatitis A.  Ultrasound in April 2021 with normal-appearing liver, spleen, and gallbladder. MRI/MRCP 08/14/2019 with gallbladder sludge without acute cholecystitis or biliary ductal dilation, no hepatic steatosis or mass. Last labs in June 2021 with  AST 154 (H), ALT 133 (H), alk phos 130 (H), bilirubin 1.1, this was increased since April (AST 58, ALT 106, alk phos 149).  No signs or symptoms of decompensated liver disease.  As LFTs have continued to rise without any explanation, I have recommended a liver biopsy.  I discussed this extensively with patient and his son again today as they have been hesitant to pursue this.  In the setting of ulcerative colitis, query whether there is some autoimmune process involved.  Patient and his son were both agreeable to pursuing liver biopsy after our discussion today.  Plan:  Patient son will call [place] scheduling to arrange liver biopsy.  He had previously been given this information.  They will call our office if they have any problems with scheduling. We will plan to follow-up in 3 months.

## 2019-11-08 NOTE — Telephone Encounter (Signed)
Questions and concerns answered at pts appointment in office today.

## 2019-11-08 NOTE — Patient Instructions (Addendum)
Please call [place] Scheduling to schedule your liver biopsy.  Please resume taking Lialda 4 tablets (4.8 g) daily with breakfast.  I have sent a prescription to your pharmacy.  Please start taking iron twice daily.  I have sent a prescription to your pharmacy.  Continue to monitor for rectal bleeding or return of abdominal pain and let me know if this occurs.  I recommend you avoid dairy products as these tend to upset your stomach.  We will plan to see you back in 3 months.  Do not hesitate to call if you have questions or concerns prior to your next visit.  Aliene Altes, PA-C [facility]

## 2019-11-08 NOTE — Assessment & Plan Note (Addendum)
59 year old male diagnosed with ulcerative pancolitis in March 2021 with associated diarrhea, rectal bleeding, and mild IDA.  Last labs in June 2021 with hemoglobin improved to 13.5, ferritin 17 (L), iron 69, saturation 15% (L), creatinine within normal limits.  He completed 8 weeks of budesonide and was also started on Lialda 4.8 g daily in April.  He was doing very well at his last visit in May 2021 and subsequently stopped Lialda on his own.  He also never resumed oral iron. He continues to do fairly well at this point with no abdominal pain.  Reports low-volume hematochezia couple weeks ago that has self resolved.  Loose stools last week in the setting of dairy consumption. No NSAIDs or tobacco use.   Plan: As patient had pancolitis, I recommended that he resume taking Lialda 4.8 g daily.  New prescription sent to pharmacy. I have also advised that he resume taking iron twice daily for now.  New prescription sent to pharmacy. Advised to continue to avoid dairy products for now as these cause diarrhea. Advised to continue to monitor for bright red blood per rectum or abdominal pain and let us know if this occurs.  Follow-up in 3 months and update labs at that time.

## 2019-11-11 ENCOUNTER — Telehealth (INDEPENDENT_AMBULATORY_CARE_PROVIDER_SITE_OTHER): Payer: Self-pay

## 2019-11-11 ENCOUNTER — Encounter (INDEPENDENT_AMBULATORY_CARE_PROVIDER_SITE_OTHER): Payer: Self-pay

## 2019-11-11 NOTE — Telephone Encounter (Signed)
Yes, he should be good.

## 2019-12-23 ENCOUNTER — Encounter (INDEPENDENT_AMBULATORY_CARE_PROVIDER_SITE_OTHER): Payer: 59 | Admitting: Internal Medicine

## 2020-01-02 ENCOUNTER — Ambulatory Visit (HOSPITAL_COMMUNITY): Payer: 59

## 2020-01-10 ENCOUNTER — Other Ambulatory Visit: Payer: Self-pay | Admitting: Student

## 2020-01-13 ENCOUNTER — Ambulatory Visit (HOSPITAL_COMMUNITY)
Admission: RE | Admit: 2020-01-13 | Discharge: 2020-01-13 | Disposition: A | Discharge status: Home / Self Care | Payer: 59 | Source: Ambulatory Visit | Attending: Gastroenterology | Admitting: Gastroenterology

## 2020-01-13 ENCOUNTER — Other Ambulatory Visit: Payer: Self-pay

## 2020-01-13 ENCOUNTER — Encounter (HOSPITAL_COMMUNITY): Payer: Self-pay

## 2020-01-13 ENCOUNTER — Encounter (INDEPENDENT_AMBULATORY_CARE_PROVIDER_SITE_OTHER): Payer: 59 | Admitting: Internal Medicine

## 2020-01-13 DIAGNOSIS — R7989 Other specified abnormal findings of blood chemistry: Secondary | ICD-10-CM | POA: Diagnosis not present

## 2020-01-13 DIAGNOSIS — K754 Autoimmune hepatitis: Secondary | ICD-10-CM

## 2020-01-13 DIAGNOSIS — K739 Chronic hepatitis, unspecified: Secondary | ICD-10-CM | POA: Insufficient documentation

## 2020-01-13 HISTORY — DX: Autoimmune hepatitis: K75.4

## 2020-01-13 LAB — CBC
HCT: 43 % (ref 39.0–52.0)
Hemoglobin: 14.4 g/dL (ref 13.0–17.0)
MCH: 29.4 pg (ref 26.0–34.0)
MCHC: 33.5 g/dL (ref 30.0–36.0)
MCV: 87.8 fL (ref 80.0–100.0)
Platelets: 322 10*3/uL (ref 150–400)
RBC: 4.9 MIL/uL (ref 4.22–5.81)
RDW: 12.7 % (ref 11.5–15.5)
WBC: 9.3 10*3/uL (ref 4.0–10.5)
nRBC: 0 % (ref 0.0–0.2)

## 2020-01-13 LAB — PROTIME-INR
INR: 1 (ref 0.8–1.2)
Prothrombin Time: 12.8 seconds (ref 11.4–15.2)

## 2020-01-13 MED ORDER — SODIUM CHLORIDE 0.9 % IV SOLN
INTRAVENOUS | Status: AC | PRN
  Administered 2020-01-13: 250 mL via INTRAVENOUS

## 2020-01-13 MED ORDER — GELATIN ABSORBABLE 12-7 MM EX MISC
CUTANEOUS | Status: AC
  Filled 2020-01-13: qty 1

## 2020-01-13 MED ORDER — SODIUM CHLORIDE 0.9 % IV SOLN: INTRAVENOUS | Status: DC

## 2020-01-13 MED ORDER — MIDAZOLAM HCL 2 MG/2ML IJ SOLN
INTRAMUSCULAR | Status: AC | PRN
  Administered 2020-01-13: 1 mg via INTRAVENOUS

## 2020-01-13 MED ORDER — FENTANYL CITRATE (PF) 100 MCG/2ML IJ SOLN
INTRAMUSCULAR | Status: AC | PRN
  Administered 2020-01-13: 50 ug via INTRAVENOUS

## 2020-01-13 MED ORDER — MIDAZOLAM HCL 2 MG/2ML IJ SOLN
INTRAMUSCULAR | Status: AC
  Filled 2020-01-13: qty 2

## 2020-01-13 MED ORDER — LIDOCAINE HCL (PF) 1 % IJ SOLN
INTRAMUSCULAR | Status: AC
  Filled 2020-01-13: qty 30

## 2020-01-13 MED ORDER — FENTANYL CITRATE (PF) 100 MCG/2ML IJ SOLN
INTRAMUSCULAR | Status: AC
  Filled 2020-01-13: qty 2

## 2020-01-13 NOTE — Discharge Instructions (Addendum)
Liver Biopsy, Care After These instructions give you information on caring for yourself after your procedure. Your doctor may also give you more specific instructions. Call your doctor if you have any problems or questions after your procedure. What can I expect after the procedure? After the procedure, it is common to have:  Pain and soreness where the biopsy was done.  Bruising around the area where the biopsy was done.  Sleepiness and be tired for a few days. Follow these instructions at home: Medicines  Take over-the-counter and prescription medicines only as told by your doctor.  If you were prescribed an antibiotic medicine, take it as told by your doctor. Do not stop taking the antibiotic even if you start to feel better.  Do not take medicines such as aspirin and ibuprofen. These medicines can thin your blood. Do not take these medicines unless your doctor tells you to take them.  If you are taking prescription pain medicine, take actions to prevent or treat constipation. Your doctor may recommend that you: ? Drink enough fluid to keep your pee (urine) clear or pale yellow. ? Take over-the-counter or prescription medicines. ? Eat foods that are high in fiber, such as fresh fruits and vegetables, whole grains, and beans. ? Limit foods that are high in fat and processed sugars, such as fried and sweet foods. Caring for your cut  Follow instructions from your doctor about how to take care of your cuts from surgery (incisions). Make sure you: ? Wash your hands with soap and water before you change your bandage (dressing). If you cannot use soap and water, use hand sanitizer. ? Change your bandage as told by your doctor. ? Leave stitches (sutures), skin glue, or skin tape (adhesive) strips in place. They may need to stay in place for 2 weeks or longer. If tape strips get loose and curl up, you may trim the loose edges. Do not remove tape strips completely unless your doctor says it is  okay.  Check your cuts every day for signs of infection. Check for: ? Redness, swelling, or more pain. ? Fluid or blood. ? Pus or a bad smell. ? Warmth.  Do not take baths, swim, or use a hot tub until your doctor says it is okay to do so. Activity   Rest at home for 1-2 days or as told by your doctor. ? Avoid sitting for a long time without moving. Get up to take short walks every 1-2 hours.  Return to your normal activities as told by your doctor. Ask what activities are safe for you.  Do not do these things in the first 24 hours: ? Drive. ? Use machinery. ? Take a bath or shower.  Do not lift more than 10 pounds (4.5 kg) or play contact sports for the first 2 weeks. General instructions   Do not drink alcohol in the first week after the procedure.  Have someone stay with you for at least 24 hours after the procedure.  Get your test results. Ask your doctor or the department that is doing the test: ? When will my results be ready? ? How will I get my results? ? What are my treatment options? ? What other tests do I need? ? What are my next steps?  Keep all follow-up visits as told by your doctor. This is important. Contact a doctor if:  A cut bleeds and leaves more than just a small spot of blood.  A cut is red, puffs up (  swells), or hurts more than before.  Fluid or something else comes from a cut.  A cut smells bad.  You have a fever or chills. Get help right away if:  You have swelling, bloating, or pain in your belly (abdomen).  You get dizzy or faint.  You have a rash.  You feel sick to your stomach (nauseous) or throw up (vomit).  You have trouble breathing, feel short of breath, or feel faint.  Your chest hurts.  You have problems talking or seeing.  You have trouble with your balance or moving your arms or legs. Summary  After the procedure, it is common to have pain, soreness, bruising, and tiredness.  Your doctor will tell you how to  take care of yourself at home. Change your bandage, take your medicines, and limit your activities as told by your doctor.  Call your doctor if you have symptoms of infection. Get help right away if your belly swells, your cut bleeds a lot, or you have trouble talking or breathing. This information is not intended to replace advice given to you by your health care provider. Make sure you discuss any questions you have with your health care provider. Document Revised: 03/10/2017 Document Reviewed: 03/10/2017 Elsevier Patient Education  [place]. Moderate Conscious Sedation, Adult Sedation is the use of medicines to promote relaxation and relieve discomfort and anxiety. Moderate conscious sedation is a type of sedation. Under moderate conscious sedation, you are less alert than normal, but you are still able to respond to instructions, touch, or both. Moderate conscious sedation is used during short medical and dental procedures. It is milder than deep sedation, which is a type of sedation under which you cannot be easily woken up. It is also milder than general anesthesia, which is the use of medicines to make you unconscious. Moderate conscious sedation allows you to return to your regular activities sooner. Tell a health care provider about:  Any allergies you have.  All medicines you are taking, including vitamins, herbs, eye drops, creams, and over-the-counter medicines.  Use of steroids (by mouth or creams).  Any problems you or family members have had with sedatives and anesthetic medicines.  Any blood disorders you have.  Any surgeries you have had.  Any medical conditions you have, such as sleep apnea.  Whether you are pregnant or may be pregnant.  Any use of cigarettes, alcohol, marijuana, or street drugs. What are the risks? Generally, this is a safe procedure. However, problems may occur, including:  Getting too much medicine (oversedation).  Nausea.  Allergic  reaction to medicines.  Trouble breathing. If this happens, a breathing tube may be used to help with breathing. It will be removed when you are awake and breathing on your own.  Heart trouble.  Lung trouble. What happens before the procedure? Staying hydrated Follow instructions from your health care provider about hydration, which may include:  Up to 2 hours before the procedure - you may continue to drink clear liquids, such as water, clear fruit juice, black coffee, and plain tea. Eating and drinking restrictions Follow instructions from your health care provider about eating and drinking, which may include:  8 hours before the procedure - stop eating heavy meals or foods such as meat, fried foods, or fatty foods.  6 hours before the procedure - stop eating light meals or foods, such as toast or cereal.  6 hours before the procedure - stop drinking milk or drinks that contain milk.  2  hours before the procedure - stop drinking clear liquids. Medicine Ask your health care provider about:  Changing or stopping your regular medicines. This is especially important if you are taking diabetes medicines or blood thinners.  Taking medicines such as aspirin and ibuprofen. These medicines can thin your blood. Do not take these medicines before your procedure if your health care provider instructs you not to.  Tests and exams  You will have a physical exam.  You may have blood tests done to show: ? How well your kidneys and liver are working. ? How well your blood can clot. General instructions  Plan to have someone take you home from the hospital or clinic.  If you will be going home right after the procedure, plan to have someone with you for 24 hours. What happens during the procedure?  An IV tube will be inserted into one of your veins.  Medicine to help you relax (sedative) will be given through the IV tube.  The medical or dental procedure will be performed. What  happens after the procedure?  Your blood pressure, heart rate, breathing rate, and blood oxygen level will be monitored often until the medicines you were given have worn off.  Do not drive for 24 hours. This information is not intended to replace advice given to you by your health care provider. Make sure you discuss any questions you have with your health care provider. Document Revised: 02/10/2017 Document Reviewed: 06/20/2015 Elsevier Patient Education  2020 [employer].

## 2020-01-13 NOTE — Progress Notes (Signed)
Discharge instructions reviewed with pt and his friend Rey (via telephone) both voice understanding.

## 2020-01-13 NOTE — Progress Notes (Signed)
Ambulated to bathroom to void tol well

## 2020-01-13 NOTE — Procedures (Signed)
Pre Procedure Dx: Elevated LFTs Post Procedural Dx: Same  Technically successful US guided biopsy of right lobe of the liver.  EBL: None  No immediate complications.   Jay Ndea Kilroy, MD Pager #: [phone]    

## 2020-01-13 NOTE — H&P (Signed)
Chief Complaint: Patient was seen in consultation today for liver core biopsy at the request of CarlKristen Reynolds  Referring Physician(Reynolds): [name]  Supervising Physician: [name]  Patient Status: Carl Reynolds - Out-pt  History of Present Illness: Carl Reynolds is a 59 y.o. 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  Otherwise IgA and IgM within normal limits.  Ceruloplasmin within normal limits.  No alpha-1 antitrypsin deficiency.  ANA and AMA negative.  No evidence of hepatitis B or C.  No immunity to hepatitis B.  Immune to hepatitis A.  Ultrasound in April 2021 with normal-appearing liver, spleen, and gallbladder. MRI/MRCP 08/14/2019 with gallbladder sludge without acute cholecystitis or biliary ductal dilation, no hepatic steatosis or mass. Last labs in June 2021 with  AST 154 (H), ALT 133 (H), alk phos 130 (H), bilirubin 1.1, this was increased since April (AST 58, ALT 106, alk phos 149).  No signs or symptoms of decompensated liver disease.  Evaluated with GI office [name] Requesting liver core biopsy secondary rising LFTs     Past Medical History:  Diagnosis Date  . 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 Pulmonary tuberculosis 1989   Treated in [place]  . Ulcerative colitis [facility])     Past Surgical History:  Procedure Laterality Date  . BIOPSY  06/07/2019   Procedure: BIOPSY;  Surgeon: [name], MD;  [facility];  Service: Endoscopy;;  . COLONOSCOPY N/A 06/07/2019   Procedure: COLONOSCOPY;  Surgeon: [name], MD; diffusely inflamed colon and rectum most consistent with ulcerative colitis/IBD, normal-appearing TI Reynolds/p segmental biopsies.  Pathology with chronic mildly active colitis without granulomata, dysplasia, or  malignancy.  Findings most likely secondary to UC.  Marland Kitchen MOUTH SURGERY  2010   Surgery for mouth cancer    Allergies: Patient has no known allergies.  Medications: Prior to Admission medications   Medication Sig Start Date End Date Taking? Authorizing Provider  mesalamine (LIALDA) 1.2 g EC tablet Take 4 tablets (4.8 g total) by mouth daily with breakfast. 11/08/19  Yes [name], PA-C  ferrous sulfate 325 (65 FE) MG tablet Take 1 tablet (325 mg total) by mouth daily. Patient not taking: Reported on 01/08/2020 11/08/19 11/07/20  [name], PA-C     Family History  Problem Relation Age of Onset  . Colon cancer Neg Hx   . Inflammatory bowel disease Neg Hx   . Liver disease Neg Hx     Social History   Socioeconomic History  . Marital status: Married    Spouse name: Not on file  . Number of children: Not on file  . Years of education: Not on file  . Highest education level: Not on file  Occupational History    Comment: Scientist, water quality at [employer].   Tobacco Use  . Smoking status: Never Smoker  . Smokeless tobacco: Never Used  Substance and Sexual Activity  . Alcohol use: Never  . Drug use: Never  . Sexual activity: Not on file  Other Topics Concern  . Not on file  Social History Narrative  . Not on file   Social Determinants of Health   Financial Resource Strain:   . Difficulty of Paying Living Expenses:  Not on file  Food Insecurity:   . Worried About Charity fundraiser in the Last Year: Not on file  . Ran Out of Food in the Last Year: Not on file  Transportation Needs:   . Lack of Transportation (Medical): Not on file  . Lack of Transportation (Non-Medical): Not on file  Physical Activity:   . Days of Exercise per Week: Not on file  . Minutes of Exercise per Session: Not on file  Stress:   . Feeling of Stress : Not on file  Social Connections:   . Frequency of Communication with Friends and Family: Not on file  . Frequency of Social  Gatherings with Friends and Family: Not on file  . Attends Religious Services: Not on file  . Active Member of Clubs or Organizations: Not on file  . Attends Archivist Meetings: Not on file  . Marital Status: Not on file     Review of Systems: A 12 point ROS discussed and pertinent positives are indicated in the HPI above.  All other systems are negative.  Review of Systems  Constitutional: Negative for activity change, fatigue, fever and unexpected weight change.  Respiratory: Negative for cough and shortness of breath.   Gastrointestinal: Negative for abdominal pain, diarrhea, nausea and vomiting.  Psychiatric/Behavioral: Negative for behavioral problems and confusion.    Vital Signs: BP (!) 153/97   Pulse 81   Temp (!) 97.5 F (36.4 C) (Oral)   Resp 16   Ht 5' 5"  (1.651 m)   Wt 136 lb 11 oz (62 kg)   SpO2 100%   BMI 22.75 kg/m   Physical Exam Vitals reviewed.  HENT:     Mouth/Throat:     Mouth: Mucous membranes are moist.  Cardiovascular:     Rate and Rhythm: Normal rate and regular rhythm.     Heart sounds: Normal heart sounds. No murmur heard.   Pulmonary:     Effort: Pulmonary effort is normal. No respiratory distress.  Abdominal:     Palpations: Abdomen is soft.     Tenderness: There is no abdominal tenderness.  Musculoskeletal:        General: Normal range of motion.  Skin:    General: Skin is warm.  Neurological:     Mental Status: He is alert and oriented to person, place, and time.  Psychiatric:        Behavior: Behavior normal.     Imaging: No results found.  Labs:  CBC: Recent Labs    06/05/19 1508 09/04/19 1501  WBC 10.1 7.8  HGB 12.9* 13.5  HCT 38.4* 41.0  PLT 405* 321    COAGS: Recent Labs    06/05/19 1508  INR 1.0    BMP: Recent Labs    06/05/19 1508 09/04/19 1501  NA 135 135  K 4.7 3.7  CL 100 101  CO2 29 26  GLUCOSE 90 97  BUN 11 7  CALCIUM 9.7 8.9  CREATININE 0.84 0.88  GFRNONAA 97 >60  GFRAA 113  >60    LIVER FUNCTION TESTS: Recent Labs    06/05/19 1508 07/10/19 1546 09/04/19 1501  BILITOT 0.6 0.9 1.1  AST 100* 58* 154*  ALT 89* 106* 133*  ALKPHOS  --  149* 130*  PROT 8.3* 7.4 7.6  ALBUMIN  --  3.8 3.9    TUMOR MARKERS: No results for input(Reynolds): AFPTM, CEA, CA199, CHROMGRNA in the last 8760 hours.  Assessment and Plan:  Elevated LFTs since Feb 2021  Work up has been negative with PCP and GI MD Now scheduled for random liver core biopsy Risks and benefits of liver core biopsy was discussed with the patient and/or patient'Reynolds family including, but not limited to bleeding, infection, damage to adjacent structures or low yield requiring additional tests.  All of the questions were answered and there is agreement to proceed. Consent signed and in chart.   Thank you for this interesting consult.  I greatly enjoyed meeting Domenik Trice and look forward to participating in their care.  A copy of this report was sent to the requesting provider on this date.  Electronically Signed: Lavonia Drafts, PA-C 01/13/2020, 12:10 PM   I spent a total of  30 Minutes   in face to face in clinical consultation, greater than 50% of which was counseling/coordinating care for liver core biopsy

## 2020-01-14 ENCOUNTER — Other Ambulatory Visit: Payer: Self-pay | Admitting: Physician Assistant

## 2020-01-18 LAB — SURGICAL PATHOLOGY

## 2020-01-20 ENCOUNTER — Telehealth: Payer: Self-pay

## 2020-01-20 ENCOUNTER — Other Ambulatory Visit: Payer: Self-pay

## 2020-01-20 NOTE — Telephone Encounter (Signed)
This resulted on 11/6. I will try to have this reviewed within the next 3 days.

## 2020-01-20 NOTE — Telephone Encounter (Signed)
VM, Received. Pt called to inquire about liver bx results.

## 2020-01-20 NOTE — Progress Notes (Signed)
Opened in error

## 2020-01-21 NOTE — Telephone Encounter (Signed)
Noted. Spoke with pt. Pt is aware that Aliene Altes, PA will review the results and we'll contact the pt with his results.

## 2020-01-23 ENCOUNTER — Telehealth: Payer: Self-pay | Admitting: Internal Medicine

## 2020-01-23 ENCOUNTER — Other Ambulatory Visit: Payer: Self-pay

## 2020-01-23 DIAGNOSIS — K754 Autoimmune hepatitis: Secondary | ICD-10-CM

## 2020-01-23 DIAGNOSIS — R7989 Other specified abnormal findings of blood chemistry: Secondary | ICD-10-CM

## 2020-01-23 NOTE — Telephone Encounter (Signed)
Noted. Orders placed and released for STAT labs at [facility] lab.

## 2020-01-23 NOTE — Telephone Encounter (Signed)
Routing message to [place]. Please advise about pts results.

## 2020-01-23 NOTE — Telephone Encounter (Signed)
Spoke with patient's son. I am planning to review with Dr. Gala Romney and will call with recommendations tomorrow.

## 2020-01-23 NOTE — Telephone Encounter (Signed)
I have requested for patient to have labs updated tomorrow. He will go to [facility], please place orders for BMP STAT, HFP STAT, IgG STAT, GGT STAT, TPMT

## 2020-01-23 NOTE — Telephone Encounter (Signed)
Routing message

## 2020-01-23 NOTE — Telephone Encounter (Signed)
Please call patient at [phone] regarding his results. I told him the nurse was at lunch and a note was put in for the provider and we would call when results were available. Patient gets upset saying that no one will talk to him and I reminded him again that the nurse and providers were at lunch and are aware to call him. He said that someone needed to call him back today when they get back from lunch.

## 2020-01-24 ENCOUNTER — Other Ambulatory Visit: Payer: Self-pay | Admitting: Gastroenterology

## 2020-01-24 ENCOUNTER — Telehealth: Payer: Self-pay | Admitting: Gastroenterology

## 2020-01-24 DIAGNOSIS — K754 Autoimmune hepatitis: Secondary | ICD-10-CM

## 2020-01-24 MED ORDER — PREDNISONE 10 MG PO TABS: ORAL_TABLET | ORAL | 0 refills | Status: DC

## 2020-01-24 NOTE — Telephone Encounter (Signed)
Liver biopsy with moderately active chronic hepatitis (grade 3 of 4) with interface activity, at least moderate fibrosis (grade 2-3-4).  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  As a histologic features are not entirely specific for AIH, patient follow-up is suggested to rule out evolving primary sclerosis cholangitis or primary biliary cholangitis.  I discussed results with Dr. Gala Romney.  In addition to updating labs which were ordered yesterday, we will go ahead and start patient on prednisone 60 mg daily x1 week followed by weekly 10 mg taper down to 30 mg, and repeat HFP and IgG in 2 weeks. As patient may also have overlying PSC or PBC, I am referring patient to the liver clinic in [facility] for further evaluation/management.  I have discussed results and recommendations with patient son as patient request we discussed all results with his son.  I am sending a prescription of prednisone to the patient's pharmacy.  We will also go ahead and place referral to [facility] liver clinic.  [facility] Clinical Pool: Please place referral to liver clinic in [place].  Patient's son request we ensure his insurance is accepted at the liver clinic.   Alicia: Please arrange HFP and IgG in 2 weeks (02/10/20).

## 2020-01-27 ENCOUNTER — Other Ambulatory Visit: Payer: Self-pay

## 2020-01-27 DIAGNOSIS — K739 Chronic hepatitis, unspecified: Secondary | ICD-10-CM

## 2020-01-27 NOTE — Addendum Note (Signed)
Addended by: Cheron Every on: 01/27/2020 07:57 AM   Modules accepted: Orders

## 2020-01-27 NOTE — Telephone Encounter (Signed)
Lab orders placed and released for 2 weeks.

## 2020-01-27 NOTE — Telephone Encounter (Signed)
Referral faxed

## 2020-01-29 LAB — HEPATIC FUNCTION PANEL
AG Ratio: 1.2 (calc) (ref 1.0–2.5)
ALT: 95 U/L — ABNORMAL HIGH (ref 9–46)
AST: 93 U/L — ABNORMAL HIGH (ref 10–35)
Albumin: 4.1 g/dL (ref 3.6–5.1)
Alkaline phosphatase (APISO): 139 U/L (ref 35–144)
Bilirubin, Direct: 0.2 mg/dL (ref 0.0–0.2)
Globulin: 3.3 g/dL (calc) (ref 1.9–3.7)
Indirect Bilirubin: 0.7 mg/dL (calc) (ref 0.2–1.2)
Total Bilirubin: 0.9 mg/dL (ref 0.2–1.2)
Total Protein: 7.4 g/dL (ref 6.1–8.1)

## 2020-01-29 LAB — BASIC METABOLIC PANEL
BUN: 7 mg/dL (ref 7–25)
CO2: 29 mmol/L (ref 20–32)
Calcium: 9.4 mg/dL (ref 8.6–10.3)
Chloride: 100 mmol/L (ref 98–110)
Creat: 0.93 mg/dL (ref 0.70–1.33)
Glucose, Bld: 107 mg/dL — ABNORMAL HIGH (ref 65–99)
Potassium: 4.7 mmol/L (ref 3.5–5.3)
Sodium: 135 mmol/L (ref 135–146)

## 2020-01-29 LAB — IGG: IgG (Immunoglobin G), Serum: 1840 mg/dL — ABNORMAL HIGH (ref 600–1640)

## 2020-01-29 LAB — GAMMA GT: GGT: 280 U/L — ABNORMAL HIGH (ref 3–85)

## 2020-01-29 LAB — THIOPURINE METHYLTRANSFERASE (TPMT), RBC: Thiopurine Methyltransferase, RBC: 13 nmol/hr/mL RBC

## 2020-01-29 NOTE — Progress Notes (Signed)
Referring Provider: Glenda Chroman, MD Primary Care Physician:  Glenda Chroman, MD Primary GI Physician: Dr. Gala Romney  Chief Complaint  Patient presents with  . elevated LFT's    HPI:   Carl Reynolds is a 59 y.o. 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  Prior MRI June 2021 with no biliary ductal abnormalities.  LFTs 01/24/2020 with AST 93, ALT 95 (down from AST 154, ALT 133 in June 2021), IgG elevated at [place] (down from 2329 in March 2021), GGT elevated at 280, TPMT normal at 13.  Patient was to start prednisone 60 mg daily x1 week on 01/24/2020 with plans to taper by 10 mg/week until 30 mg of prednisone daily is reached.  He has also been referred to [facility] liver clinic for further evaluation and management due to liver biopsy not entirely specific for AIH. He is due for updated HFP and IgG 02/10/2020.  He was last seen in our office 11/08/2019.  He had stopped Lialda 3 months prior as he did not think he needed to continue this medication.  Reported a little blood in his stool couple weeks prior.  Occasional loose stools following dairy consumption.  No abdominal pain.  No signs or symptoms of decompensated liver disease.  He had not resumed iron.  No NSAIDs, alcohol, OTC supplements other than multivitamin, or tobacco use.  Advised to resume Lialda 4.8 g daily, resume iron twice daily, avoid dairy products, follow-up in 3 months.  Today: Patient called his son to have him be a part of our visit today.  States he is feeling well.  No complaints. No abdominal pain. No blood in the stool.  Taking Lialda 4.8 g daily.  2 BMs daily but feels BMs are incomplete. No many fruits and vegetables. Good appetite.  No  fatigue, lightheadedness, dizziness on feel like will pass out.  Continues to take iron daily.  No reflux symptoms, trouble swallowing, nausea, vomiting.  Has an appointment for 12/6 to be seen at the liver clinic in [place].  He is currently taking prednisone 60 mg daily and understands the planned prednisone taper.  He is able to reiterate this to me.  No confusion, swelling in abdomen or lower extremities, yellowing of eyes or skin, bruising or bleeding, or dark urine.  At the end of our visit, patient tried to call his cousin, Dr. Posey Pronto, who is also a gastroenterologist.  Stated he was going to talk to me.  Unfortunately, Dr. Posey Pronto did not answer the phone. Gave patient my card for Dr. Posey Pronto to call when he has time. Patient gave consent to discuss all medical information with his cousin Dr. Posey Pronto.   Past Medical History:  Diagnosis Date  . 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 Pulmonary tuberculosis 1989   Treated in [place]  . Ulcerative colitis [facility])     Past Surgical History:  Procedure Laterality Date  . BIOPSY  06/07/2019   Procedure: BIOPSY;  Surgeon: [name], MD;  [facility];  Service: Endoscopy;;  . COLONOSCOPY N/A 06/07/2019   Procedure: COLONOSCOPY;  Surgeon:  Rourk, Cristopher Estimable, MD; diffusely inflamed colon and rectum most consistent with ulcerative colitis/IBD, normal-appearing TI s/p segmental biopsies.  Pathology with chronic mildly active colitis without granulomata, dysplasia, or malignancy.  Findings most likely secondary to UC.  Marland Kitchen MOUTH SURGERY  2010   Surgery for mouth cancer    Current Outpatient Medications  Medication Sig Dispense Refill  . ferrous sulfate 325 (65 FE) MG tablet Take 1 tablet (325 mg total) by mouth daily. 60 tablet 3  . mesalamine (LIALDA) 1.2 g EC tablet Take 4 tablets (4.8 g total) by mouth daily with breakfast. 120 tablet  3  . predniSONE (DELTASONE) 10 MG tablet Take 6 tablets (60 mg total) by mouth daily x7 days, then take 5 tablets (50 mg total) by mouth daily x7 days, then take 4 tablets (40 mg total) by mouth daily x7 days, then take 3 tablets (30 mg total) by mouth daily and continue this daily. 147 tablet 0   No current facility-administered medications for this visit.    Allergies as of 01/30/2020  . (No Known Allergies)    Family History  Problem Relation Age of Onset  . Colon cancer Neg Hx   . Inflammatory bowel disease Neg Hx   . Liver disease Neg Hx     Social History   Socioeconomic History  . Marital status: Married    Spouse name: Not on file  . Number of children: Not on file  . Years of education: Not on file  . Highest education level: Not on file  Occupational History    Comment: Scientist, water quality at [employer].   Tobacco Use  . Smoking status: Never Smoker  . Smokeless tobacco: Never Used  Substance and Sexual Activity  . Alcohol use: Never  . Drug use: Never  . Sexual activity: Not on file  Other Topics Concern  . Not on file  Social History Narrative  . Not on file   Social Determinants of Health   Financial Resource Strain:   . Difficulty of Paying Living Expenses: Not on file  Food Insecurity:   . Worried About Charity fundraiser in the Last Year: Not on file  . Ran Out of Food in the Last Year: Not on file  Transportation Needs:   . Lack of Transportation (Medical): Not on file  . Lack of Transportation (Non-Medical): Not on file  Physical Activity:   . Days of Exercise per Week: Not on file  . Minutes of Exercise per Session: Not on file  Stress:   . Feeling of Stress : Not on file  Social Connections:   . Frequency of Communication with Friends and Family: Not on file  . Frequency of Social Gatherings with Friends and Family: Not on file  . Attends Religious Services: Not on file  . Active Member of Clubs or Organizations: Not on file  . Attends  Archivist Meetings: Not on file  . Marital Status: Not on file    Review of Systems: Gen: Denies fever, chills, cold or flulike symptoms, presyncope, syncope CV: Denies chest pain or palpitations Resp: Denies dyspnea.  Occasional cough. GI: See HPI Heme: See HPI  Physical Exam: BP 136/82   Pulse 76   Temp (!) 97 F (36.1 C)   Ht 5' 5"  (1.651 m)   Wt 145 lb (65.8 kg)   BMI 24.13 kg/m  General:   Alert and oriented. No distress noted. Pleasant and cooperative.  Head:  Normocephalic and atraumatic.  Eyes:  Conjuctiva clear without scleral icterus. Heart:  S1, S2 present without murmurs appreciated. Lungs:  Clear to auscultation bilaterally. No wheezes, rales, or rhonchi. No distress.  Abdomen:  +BS, soft, non-tender and non-distended. No rebound or guarding. No HSM or masses noted. Msk:  Symmetrical without gross deformities. Normal posture. Extremities:  Without edema. Neurologic:  Alert and  oriented x4 Psych:  Normal mood and affect.

## 2020-01-30 ENCOUNTER — Other Ambulatory Visit: Payer: Self-pay

## 2020-01-30 ENCOUNTER — Encounter: Payer: Self-pay | Admitting: Internal Medicine

## 2020-01-30 ENCOUNTER — Ambulatory Visit (INDEPENDENT_AMBULATORY_CARE_PROVIDER_SITE_OTHER): Payer: 59 | Admitting: Gastroenterology

## 2020-01-30 ENCOUNTER — Encounter: Payer: Self-pay | Admitting: Gastroenterology

## 2020-01-30 VITALS — BP 136/82 | HR 76 | Temp 97.0°F | Ht 65.0 in | Wt 145.0 lb

## 2020-01-30 DIAGNOSIS — R7989 Other specified abnormal findings of blood chemistry: Secondary | ICD-10-CM

## 2020-01-30 DIAGNOSIS — D509 Iron deficiency anemia, unspecified: Secondary | ICD-10-CM | POA: Diagnosis not present

## 2020-01-30 DIAGNOSIS — K51011 Ulcerative (chronic) pancolitis with rectal bleeding: Secondary | ICD-10-CM

## 2020-01-30 DIAGNOSIS — D591 Autoimmune hemolytic anemia, unspecified: Secondary | ICD-10-CM | POA: Insufficient documentation

## 2020-01-30 NOTE — Assessment & Plan Note (Signed)
Addressed under elevated LFTs. 

## 2020-01-30 NOTE — Assessment & Plan Note (Addendum)
Patient was found to have elevated LFTs in February 2021.  Extensive serologic evaluation with IgG elevated at 2329 and ASMA elevated at 24.  Ultrasound in April 2021 with normal-appearing liver and spleen.  MRI June 2021 with no ductal abnormalities.  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  No signs or symptoms of decompensated liver disease.  Most recent labs 01/24/2020 with AST 93, ALT 95 (down from AST 154, ALT 133 in June 2021), IgG elevated at [place] (down from 2329 in March 2021), GGT elevated at 280, TPMT normal at 13.  He was started on prednisone 60 mg daily x1 week on 01/24/2020 with plans to taper by 10 mg/week until 30 mg of prednisone daily is reached.  He has also been referred to [facility] liver clinic and has an appointment on 12/6.   I suspect patient has autoimmune hepatitis.  Labs and imaging do not seem consistent with PBC or PSC.  We will defer further evaluation and management to liver clinic in [place].  Plan: Continue with prednisone taper as planned to 30 mg daily and continue this dose. Repeat HFP and IgG 11/29 Keep upcoming appointment with [facility] liver clinic on 12/6. Follow-up in 4 months for ulcerative colitis.

## 2020-01-30 NOTE — Assessment & Plan Note (Signed)
Addressed under ulcerative colitis. 

## 2020-01-30 NOTE — Assessment & Plan Note (Addendum)
59 year old male diagnosed with ulcerative pancolitis in March 2021 with associated diarrhea, rectal bleeding, and mild IDA.  Last CBC/iron panel in June 2021 with hemoglobin improved to 13.5, ferritin 17 (L), iron 69, saturation of 15% (L).  He resumed oral iron in August.  Currently, he is maintaining well on Lialda 4.8 g daily. Creatinine was within normal limits on 11/12.  He does report some incomplete bowel movements.  Suspect this may be secondary to oral iron.   Plan: Continue Lialda 4.8 g daily for now. Notably, he had stopped Lialda previously on his own and developed a couple episodes of low-volume hematochezia in August 2021.  May consider reducing Lialda at his follow-up visit if he continues to do well. Continue oral iron for now. Update CBC and iron panel on 11/29 Increase daily fruit and vegetable intake Add Benefiber or Metamucil daily Follow-up in 4 months.

## 2020-01-30 NOTE — Patient Instructions (Signed)
Please have labs completed on 11/29.   Continue taking Lialda 4.8 g (4 tablets) daily.   Continue taking prednisone 60 mg daily x1 week, then 50 mg daily x1 week, then 40 mg x 1 week, then 30 mg x 1 week and continue this.  Add benefiber or Matamucil daily to help with mild constipation. You can follow instructions on the package.   Monitor for bright red blood in your stool or black stools and let us know if this occurs.   Continue iron daily for now.   Keep your upcoming appointment with the Liver Clinic in [place].  We will see you in 4 months. Please call with questions or concerns prior.   Aliene Altes, PA-C [facility]

## 2020-02-15 LAB — IRON,TIBC AND FERRITIN PANEL
%SAT: 35 % (calc) (ref 20–48)
Ferritin: 79 ng/mL (ref 38–380)
Iron: 109 ug/dL (ref 50–180)
TIBC: 308 mcg/dL (calc) (ref 250–425)

## 2020-02-15 LAB — CBC WITH DIFFERENTIAL/PLATELET
Absolute Monocytes: 1175 cells/uL — ABNORMAL HIGH (ref 200–950)
Basophils Absolute: 44 cells/uL (ref 0–200)
Basophils Relative: 0.3 %
Eosinophils Absolute: 73 cells/uL (ref 15–500)
Eosinophils Relative: 0.5 %
HCT: 40.9 % (ref 38.5–50.0)
Hemoglobin: 14 g/dL (ref 13.2–17.1)
Lymphs Abs: 3857 cells/uL (ref 850–3900)
MCH: 30.4 pg (ref 27.0–33.0)
MCHC: 34.2 g/dL (ref 32.0–36.0)
MCV: 88.7 fL (ref 80.0–100.0)
MPV: 9.4 fL (ref 7.5–12.5)
Monocytes Relative: 8.1 %
Neutro Abs: 9353 cells/uL — ABNORMAL HIGH (ref 1500–7800)
Neutrophils Relative %: 64.5 %
Platelets: 297 10*3/uL (ref 140–400)
RBC: 4.61 10*6/uL (ref 4.20–5.80)
RDW: 12.8 % (ref 11.0–15.0)
Total Lymphocyte: 26.6 %
WBC: 14.5 10*3/uL — ABNORMAL HIGH (ref 3.8–10.8)

## 2020-02-15 LAB — ADD ON HEP FUNCT PNL
AG Ratio: 1.3 (calc) (ref 1.0–2.5)
ALT: 90 U/L — ABNORMAL HIGH (ref 9–46)
AST: 57 U/L — ABNORMAL HIGH (ref 10–35)
Albumin: 3.9 g/dL (ref 3.6–5.1)
Alkaline phosphatase (APISO): 96 U/L (ref 35–144)
Bilirubin, Direct: 0.2 mg/dL (ref 0.0–0.2)
Globulin: 3.1 g/dL (calc) (ref 1.9–3.7)
Indirect Bilirubin: 0.5 mg/dL (calc) (ref 0.2–1.2)
Total Bilirubin: 0.7 mg/dL (ref 0.2–1.2)
Total Protein: 7 g/dL (ref 6.1–8.1)

## 2020-02-15 LAB — TEST AUTHORIZATION

## 2020-02-15 LAB — IGG: IgG (Immunoglobin G), Serum: 1399 mg/dL (ref 600–1640)

## 2020-02-21 ENCOUNTER — Other Ambulatory Visit: Payer: Self-pay | Admitting: Gastroenterology

## 2020-02-21 DIAGNOSIS — D72829 Elevated white blood cell count, unspecified: Secondary | ICD-10-CM

## 2020-02-21 DIAGNOSIS — R7989 Other specified abnormal findings of blood chemistry: Secondary | ICD-10-CM

## 2020-02-21 DIAGNOSIS — K754 Autoimmune hepatitis: Secondary | ICD-10-CM

## 2020-02-25 ENCOUNTER — Other Ambulatory Visit: Payer: Self-pay | Admitting: Gastroenterology

## 2020-02-25 DIAGNOSIS — K754 Autoimmune hepatitis: Secondary | ICD-10-CM

## 2020-02-25 DIAGNOSIS — K51011 Ulcerative (chronic) pancolitis with rectal bleeding: Secondary | ICD-10-CM

## 2020-03-01 ENCOUNTER — Telehealth: Payer: Self-pay | Admitting: Gastroenterology

## 2020-03-01 NOTE — Telephone Encounter (Signed)
Carl Reynolds, patient was to have labs completed 12/15. Labs have not been completed. Can you reach out to remind patient/patient's son about having labs completed?

## 2020-03-02 ENCOUNTER — Ambulatory Visit (INDEPENDENT_AMBULATORY_CARE_PROVIDER_SITE_OTHER): Payer: 59 | Admitting: Internal Medicine

## 2020-03-02 NOTE — Telephone Encounter (Signed)
Lmom for pts son. Waiting on a return call.

## 2020-03-02 NOTE — Telephone Encounter (Signed)
Noted  

## 2020-03-02 NOTE — Telephone Encounter (Signed)
Pt's son returned call. He is aware that labs are needed and planned to have his fathers labs done tomorrow or Wednesday of this week.

## 2020-03-03 ENCOUNTER — Telehealth: Payer: Self-pay

## 2020-03-03 NOTE — Telephone Encounter (Signed)
Received fax and copy of letter sent to pt from [facility] Liver clinic. They have been unable to reach pt to schedule appt.   I mailed letter to pt also. Will have fax scanned to chart.

## 2020-03-04 LAB — HEPATIC FUNCTION PANEL
AG Ratio: 1.6 (calc) (ref 1.0–2.5)
ALT: 81 U/L — ABNORMAL HIGH (ref 9–46)
AST: 26 U/L (ref 10–35)
Albumin: 3.9 g/dL (ref 3.6–5.1)
Alkaline phosphatase (APISO): 64 U/L (ref 35–144)
Bilirubin, Direct: 0.2 mg/dL (ref 0.0–0.2)
Globulin: 2.5 g/dL (calc) (ref 1.9–3.7)
Indirect Bilirubin: 0.6 mg/dL (calc) (ref 0.2–1.2)
Total Bilirubin: 0.8 mg/dL (ref 0.2–1.2)
Total Protein: 6.4 g/dL (ref 6.1–8.1)

## 2020-03-04 LAB — CBC WITH DIFFERENTIAL/PLATELET
Absolute Monocytes: 920 cells/uL (ref 200–950)
Basophils Absolute: 59 cells/uL (ref 0–200)
Basophils Relative: 0.5 %
Eosinophils Absolute: 83 cells/uL (ref 15–500)
Eosinophils Relative: 0.7 %
HCT: 40.5 % (ref 38.5–50.0)
Hemoglobin: 14 g/dL (ref 13.2–17.1)
Lymphs Abs: 4236 cells/uL — ABNORMAL HIGH (ref 850–3900)
MCH: 30.8 pg (ref 27.0–33.0)
MCHC: 34.6 g/dL (ref 32.0–36.0)
MCV: 89 fL (ref 80.0–100.0)
MPV: 9.2 fL (ref 7.5–12.5)
Monocytes Relative: 7.8 %
Neutro Abs: 6502 cells/uL (ref 1500–7800)
Neutrophils Relative %: 55.1 %
Platelets: 309 10*3/uL (ref 140–400)
RBC: 4.55 10*6/uL (ref 4.20–5.80)
RDW: 13 % (ref 11.0–15.0)
Total Lymphocyte: 35.9 %
WBC: 11.8 10*3/uL — ABNORMAL HIGH (ref 3.8–10.8)

## 2020-03-04 LAB — IGG: IgG (Immunoglobin G), Serum: 1093 mg/dL (ref 600–1640)

## 2020-03-17 ENCOUNTER — Other Ambulatory Visit: Payer: Self-pay | Admitting: Gastroenterology

## 2020-03-17 DIAGNOSIS — K754 Autoimmune hepatitis: Secondary | ICD-10-CM

## 2020-03-25 ENCOUNTER — Other Ambulatory Visit: Payer: Self-pay | Admitting: Gastroenterology

## 2020-03-25 DIAGNOSIS — K51011 Ulcerative (chronic) pancolitis with rectal bleeding: Secondary | ICD-10-CM

## 2020-04-02 ENCOUNTER — Ambulatory Visit (INDEPENDENT_AMBULATORY_CARE_PROVIDER_SITE_OTHER): Payer: 59 | Admitting: Internal Medicine

## 2020-04-02 ENCOUNTER — Other Ambulatory Visit: Payer: Self-pay

## 2020-04-02 ENCOUNTER — Encounter (INDEPENDENT_AMBULATORY_CARE_PROVIDER_SITE_OTHER): Payer: Self-pay | Admitting: Internal Medicine

## 2020-04-02 VITALS — BP 126/80 | HR 89 | Temp 97.3°F | Resp 19 | Ht 63.0 in | Wt 151.2 lb

## 2020-04-02 DIAGNOSIS — Z13220 Encounter for screening for lipoid disorders: Secondary | ICD-10-CM

## 2020-04-02 DIAGNOSIS — R5381 Other malaise: Secondary | ICD-10-CM

## 2020-04-02 DIAGNOSIS — E559 Vitamin D deficiency, unspecified: Secondary | ICD-10-CM

## 2020-04-02 DIAGNOSIS — Z125 Encounter for screening for malignant neoplasm of prostate: Secondary | ICD-10-CM

## 2020-04-02 DIAGNOSIS — K51011 Ulcerative (chronic) pancolitis with rectal bleeding: Secondary | ICD-10-CM | POA: Diagnosis not present

## 2020-04-02 DIAGNOSIS — R5383 Other fatigue: Secondary | ICD-10-CM

## 2020-04-02 DIAGNOSIS — Z131 Encounter for screening for diabetes mellitus: Secondary | ICD-10-CM

## 2020-04-02 NOTE — Progress Notes (Signed)
Metrics: Intervention Frequency ACO  Documented Smoking Status Yearly  Screened one or more times in 24 months  Cessation Counseling or  Active cessation medication Past 24 months  Past 24 months   Guideline developer: UpToDate (See UpToDate for funding source) Date Released: 2014       Wellness Office Visit  Subjective:  Patient ID: Carl Reynolds, male    DOB: 1960/03/23  Age: 60 y.o. MRN: [id number]  CC: This 60 year old man comes to our practice as a new patient to establish care. HPI He has a history of ulcerative colitis and also autoimmune hepatitis possibly based on liver biopsy.  He follows with gastroenterology regarding this.  According to the notes, he was supposed to be taking Lialda but in fact is not and he is not aware that he was supposed to be taking this medication.  He continues with prednisone 30 mg daily. Otherwise, he feels intermittently fatigued but no other significant complaints.  Past Medical History:  Diagnosis Date  . 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 Pulmonary tuberculosis 1989   Treated in [place]  . Ulcerative colitis [facility])    Past Surgical History:  Procedure Laterality Date  . BIOPSY  06/07/2019   Procedure: BIOPSY;  Surgeon: [name], MD;  [facility];  Service: Endoscopy;;  . COLONOSCOPY N/A 06/07/2019   Procedure: COLONOSCOPY;  Surgeon: [name], MD; diffusely inflamed colon and rectum most consistent with ulcerative colitis/IBD, normal-appearing TI s/p segmental biopsies.  Pathology with chronic mildly active colitis without granulomata, dysplasia, or malignancy.  Findings most likely secondary to UC.  Marland Kitchen MOUTH SURGERY  2010   Surgery for mouth cancer     Family History  Problem Relation Age of Onset  . Diabetes Mother   . Cancer Father   . Colon cancer Neg Hx   . Inflammatory bowel disease Neg Hx   .  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   Social History   Tobacco Use  . Smoking status: Never Smoker  . Smokeless tobacco: Never Used  Substance Use Topics  . Alcohol use: Never    Current Meds  Medication Sig  . predniSONE (DELTASONE) 10 MG tablet Take 3 tablets (30 mg total) by mouth daily.      No flowsheet data found.   Objective:   Today's Vitals: BP 126/80 (BP Location: Right Arm, Patient Position: Sitting, Cuff Size: Normal)   Pulse 89   Temp (!) 97.3 F (36.3 C) (Temporal)   Resp 19   Ht 5' 3"  (1.6 m)   Wt 151 lb 3.2 oz (68.6 kg)   SpO2 98%   BMI 26.78 kg/m  Vitals with BMI 04/02/2020 01/30/2020 01/13/2020  Height 5' 3"  5' 5"  -  Weight 151 lbs 3 oz 145 lbs -  BMI 94.85 46.27 -  Systolic 035 009 -  Diastolic 80 82 -  Pulse 89 76 90     Physical Exam  He is overweight but blood pressure is acceptable.     Assessment   1. Ulcerative pancolitis with rectal bleeding ([place])   2. Malaise and fatigue   3. Vitamin D deficiency disease   4. Screening for diabetes mellitus   5. Special screening for malignant neoplasm of prostate   6. Screening for lipoid disorders  Tests ordered Orders Placed This Encounter  Procedures  . COMPLETE METABOLIC PANEL WITH GFR  . Lipid panel  . Hemoglobin A1c  . T3, free  . T4, free  . TSH  . VITAMIN D 25 Hydroxy (Vit-D Deficiency, Fractures)     Plan: 1. I will message the gastroenterologist regarding his Lialda and a new prescription needs to go to his pharmacy. 2. Otherwise we will do blood work regarding his overall health in terms of his intermittent fatigue. 3. Follow-up in 3 months.   No orders of the defined types were placed in this encounter.   Doree Albee, MD

## 2020-04-03 LAB — HEMOGLOBIN A1C
Hgb A1c MFr Bld: 6.1 % of total Hgb — ABNORMAL HIGH (ref <5.7)
Mean Plasma Glucose: 128 mg/dL
eAG (mmol/L): 7.1 mmol/L

## 2020-04-03 LAB — COMPLETE METABOLIC PANEL WITH GFR
AG Ratio: 1.8 (calc) (ref 1.0–2.5)
ALT: 207 U/L — ABNORMAL HIGH (ref 9–46)
AST: 93 U/L — ABNORMAL HIGH (ref 10–35)
Albumin: 4.1 g/dL (ref 3.6–5.1)
Alkaline phosphatase (APISO): 89 U/L (ref 35–144)
BUN: 11 mg/dL (ref 7–25)
CO2: 28 mmol/L (ref 20–32)
Calcium: 9.4 mg/dL (ref 8.6–10.3)
Chloride: 101 mmol/L (ref 98–110)
Creat: 0.9 mg/dL (ref 0.70–1.33)
GFR, Est African American: 109 mL/min/{1.73_m2} (ref >=60)
GFR, Est Non African American: 94 mL/min/{1.73_m2} (ref >=60)
Globulin: 2.3 g/dL (calc) (ref 1.9–3.7)
Glucose, Bld: 176 mg/dL — ABNORMAL HIGH (ref 65–99)
Potassium: 4.7 mmol/L (ref 3.5–5.3)
Sodium: 137 mmol/L (ref 135–146)
Total Bilirubin: 1 mg/dL (ref 0.2–1.2)
Total Protein: 6.4 g/dL (ref 6.1–8.1)

## 2020-04-03 LAB — T4, FREE: Free T4: 1.1 ng/dL (ref 0.8–1.8)

## 2020-04-03 LAB — LIPID PANEL
Cholesterol: 232 mg/dL — ABNORMAL HIGH (ref <200)
HDL: 92 mg/dL (ref >=40)
LDL Cholesterol (Calc): 103 mg/dL (calc) — ABNORMAL HIGH
Non-HDL Cholesterol (Calc): 140 mg/dL (calc) — ABNORMAL HIGH (ref <130)
Total CHOL/HDL Ratio: 2.5 (calc) (ref <5.0)
Triglycerides: 258 mg/dL — ABNORMAL HIGH (ref <150)

## 2020-04-03 LAB — T3, FREE: T3, Free: 3.3 pg/mL (ref 2.3–4.2)

## 2020-04-03 LAB — VITAMIN D 25 HYDROXY (VIT D DEFICIENCY, FRACTURES): Vit D, 25-Hydroxy: 16 ng/mL — ABNORMAL LOW (ref 30–100)

## 2020-04-03 LAB — TSH: TSH: 1.83 mIU/L (ref 0.40–4.50)

## 2020-04-06 ENCOUNTER — Telehealth: Payer: Self-pay | Admitting: Gastroenterology

## 2020-04-06 NOTE — Progress Notes (Signed)
Please call this patient and have him follow-up with me earlier in the next couple of weeks to discuss all his results in detail.  Thanks.

## 2020-04-06 NOTE — Progress Notes (Signed)
Called ;no answer. Left voice massage. Also set appt to come back for lab results & discussion for 2/15 @5  pm.

## 2020-04-06 NOTE — Telephone Encounter (Signed)
Reviewed labs completed with PCP dated 04/02/2020.  LFTs elevated with ALT 207, AST 93, alk phos and total bilirubin within normal limits.  This is increased from ALT 81 and AST 26, 1 month ago.  Received secure chat from patient's PCP Dr. Anastasio Champion on 04/02/2020 stating patient was taking prednisone 30 mg daily but was not taking Lialda.  Stated prior patient was not aware he should be taking Lialda with prednisone. I am not sure how this is as he was taking both prednisone and Lialda at his last his last visit, and every time I have spoken with patient's son, he reports his father is still taking Prednisone. Dr. Anastasio Champion asked if I would represcribe Lialda.  I advised that I would hold off on represcribing Lialda as we may be starting azathioprine due to autoimmune hepatitis pending lab results.  With liver enzymes increasing, query whether patient may have stopped prednisone.  We need to clarify his medications prior to additional recommendations.  He really needs to see hepatology to gain better control of his liver disease.  Previously referred to liver clinic in [place], but there was some question as to whether insurance was accepted.  I had previously asked patient's son to call his insurance company to verify. I had also offered referral to [facility] or [facility] but son stated this would be too far. We may need to revisit this.     I have tried to reach out to patient with an interpreter, but did not get an answer.  I have also tried to reach patient's son and had to leave message on machine requesting return call.   Elmo Putt, if patient's son returns call, please ask for a good time to reach him so I can discuss results.

## 2020-04-09 ENCOUNTER — Telehealth (INDEPENDENT_AMBULATORY_CARE_PROVIDER_SITE_OTHER): Payer: Self-pay

## 2020-04-09 NOTE — Telephone Encounter (Signed)
Stacey please schedule a ov with Cyril Mourning and make sure an interpreter is available.

## 2020-04-09 NOTE — Telephone Encounter (Signed)
I tried calling patient's son again this afternoon. Patient's son answered and asked who was speaking. I told him who I was, and he hung up the phone. I tried calling back and was sent to voice mail. I left a message notifying who I was, that I wasn't sure if we were accidentally disconnected, and that I would like for him to call our office to let us know of a good time for me to talk with him about his fathers lab results.   Izora Ribas. Not sure what else to do about this. I have tried to call the patient's spouse with a translator to try to speak with the patient directly but was also unsuccessful. I am not sure if the patient is aware of what is going on as he previously asked for me to talk with his son and I have no direct number to reach the patient. Maybe we need to mail a letter asking for the patient to call to discuss lab results? It is becoming very difficult to treat this patient's complex condition.   Dr. Landry Corporal

## 2020-04-09 NOTE — Telephone Encounter (Signed)
Strange; significant gi loose ends;; Kristen, would be best to get him back in with [facility] interpretor for face to face.  I not sure about his home situation.  Getting him back in a good idea.  At least a one time hepatology clinic visit would also be a good idea.

## 2020-04-09 NOTE — Telephone Encounter (Signed)
Tried calling patient's son again this morning. Left message on machine requesting return call.

## 2020-04-10 NOTE — Telephone Encounter (Signed)
Discussed with Aliene Altes at length.  This is a challenging case.  We currently do not have a productive doctor-patient relationship. Please schedule an appointment with me in the next 4 weeks or so (last morning slot or last afternoon slot).  Will need to have a [facility] interpreter available.

## 2020-04-13 ENCOUNTER — Encounter: Payer: Self-pay | Admitting: Internal Medicine

## 2020-04-13 NOTE — Telephone Encounter (Signed)
Patient scheduled with dr Gala Romney and was left a message about appointment

## 2020-04-13 NOTE — Telephone Encounter (Signed)
NotedErline Reynolds, can we also mail patient a letter regarding appointment change?

## 2020-04-13 NOTE — Telephone Encounter (Signed)
Letter was sent

## 2020-04-15 NOTE — Telephone Encounter (Signed)
l have to come in office to have a f/u to discuss lab results and any concerns. Dr Anastasio Champion is not taking calls for post visit over phone.

## 2020-04-23 ENCOUNTER — Ambulatory Visit (INDEPENDENT_AMBULATORY_CARE_PROVIDER_SITE_OTHER): Payer: 59 | Admitting: Internal Medicine

## 2020-04-28 ENCOUNTER — Ambulatory Visit (INDEPENDENT_AMBULATORY_CARE_PROVIDER_SITE_OTHER): Payer: 59 | Admitting: Internal Medicine

## 2020-04-28 ENCOUNTER — Encounter (INDEPENDENT_AMBULATORY_CARE_PROVIDER_SITE_OTHER): Payer: Self-pay | Admitting: Internal Medicine

## 2020-04-28 VITALS — BP 142/90 | HR 72 | Ht 63.0 in | Wt 153.0 lb

## 2020-04-28 DIAGNOSIS — E785 Hyperlipidemia, unspecified: Secondary | ICD-10-CM

## 2020-04-28 DIAGNOSIS — E559 Vitamin D deficiency, unspecified: Secondary | ICD-10-CM | POA: Diagnosis not present

## 2020-04-28 DIAGNOSIS — R7303 Prediabetes: Secondary | ICD-10-CM

## 2020-04-28 NOTE — Progress Notes (Signed)
Metrics: Intervention Frequency ACO  Documented Smoking Status Yearly  Screened one or more times in 24 months  Cessation Counseling or  Active cessation medication Past 24 months  Past 24 months   Guideline developer: UpToDate (See UpToDate for funding source) Date Released: 2014       Wellness Office Visit  Subjective:  Patient ID: Carl Reynolds, male    DOB: 04-04-1960  Age: 60 y.o. MRN: [id number]  CC: This man comes in for follow-up regarding his blood work and further recommendations. HPI  He has significant vitamin D deficiency.  He also has dyslipidemia.  He is also prediabetic.  I discussed all these numbers with him. I question him regarding his medications that he supposed to be taking.  He assures me that he is taking indeed prednisone 30 mg daily which may explain his dyslipidemia and prediabetes also.  He eats an [place] which unfortunately is not the most optimal diet. Past Medical History:  Diagnosis Date  . Autoimmune hepatitis (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 Pulmonary tuberculosis 1989   Treated in [place]  . Ulcerative colitis [facility])    Past Surgical History:  Procedure Laterality Date  . BIOPSY  06/07/2019   Procedure: BIOPSY;  Surgeon: [name], MD;  [facility];  Service: Endoscopy;;  . COLONOSCOPY N/A 06/07/2019   Procedure: COLONOSCOPY;  Surgeon: [name], MD; diffusely inflamed colon and rectum most consistent with ulcerative colitis/IBD, normal-appearing TI s/p segmental biopsies.  Pathology with chronic mildly active colitis without granulomata, dysplasia, or malignancy.  Findings most likely secondary to UC.  Marland Kitchen MOUTH SURGERY  2010   Surgery for mouth cancer     Family History  Problem Relation Age of Onset  . Diabetes Mother   . Cancer Father   . Colon cancer Neg Hx   . Inflammatory bowel disease Neg Hx   . 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   Social History   Tobacco Use  . Smoking status: Never Smoker  . Smokeless tobacco: Never Used  Substance Use Topics  . Alcohol use: Never    No outpatient medications have been marked as taking for the 04/28/20 encounter (Office Visit) with Doree Albee, MD.       Objective:   Today's Vitals: BP (!) 142/90   Pulse 72   Ht 5' 3"  (1.6 m)   Wt 153 lb (69.4 kg)   BMI 27.10 kg/m  Vitals with BMI 04/28/2020 04/02/2020 01/30/2020  Height 5' 3"  5' 3"  5' 5"   Weight 153 lbs 151 lbs 3 oz 145 lbs  BMI 27.11 78.67 67.20  Systolic 947 096 283  Diastolic 90 80 82  Pulse 72 89 76     Physical Exam  He is overweight.  Blood pressure today slightly elevated.     Assessment   1. Prediabetes   2. Vitamin D deficiency disease   3. Dyslipidemia       Tests ordered No orders of the defined types were placed in this encounter.    Plan: 1. We discussed all his blood results in detail. 2. I recommended he start taking vitamin D3 10,000 units daily. 3. His prediabetes is partly because of his chronic steroids but I have discussed with him nutrition and how he needs to  cut back on adding sugars to his tea and cutting back on eating typical [place] foods.  His wife is making changes also so hopefully he will follow. 4. Follow-up as scheduled in April.   No orders of the defined types were placed in this encounter.   Doree Albee, MD

## 2020-05-26 ENCOUNTER — Ambulatory Visit (INDEPENDENT_AMBULATORY_CARE_PROVIDER_SITE_OTHER): Payer: 59 | Admitting: Internal Medicine

## 2020-05-26 ENCOUNTER — Encounter: Payer: Self-pay | Admitting: Internal Medicine

## 2020-05-26 ENCOUNTER — Other Ambulatory Visit: Payer: Self-pay

## 2020-05-26 VITALS — BP 156/94 | HR 87 | Temp 97.8°F | Ht <= 58 in | Wt 154.8 lb

## 2020-05-26 DIAGNOSIS — Z8611 Personal history of tuberculosis: Secondary | ICD-10-CM

## 2020-05-26 NOTE — Progress Notes (Addendum)
Primary Care Physician:  Glenda Chroman, MD Primary Gastroenterologist:  Dr. Gala Romney  Pre-Procedure History & Physical: HPI:  Carl Reynolds is a 60 y.o. male here for follow-up of pan ulcerative colitis elevated LFTs with bridging fibrosis/histological findings more consistent with autoimmune hepatitis rather than PSC/PBC or overlap syndrome.Marland Kitchen  MRCP negative.  Patient has been maintained on prednisone 30 mg daily.  Not currently taking mesalamine preparation. Did not make it to his liver clinic appointment in [place] because they do not take his insurance. AMA is negative Patient has normal TMPT enzyme activity.   History of pulmonary tuberculosis treated in [place]  Please note interview took place in exam room 1-utilizing a [place] interpreter on speaker phone and patient's son was also on speaker phone with patient's phone for the interview encounter.  Patient states that he is having no more blood per rectum or diarrhea.  His only medication is prednisone 30 mg daily he is having formed stools; no abdominal pain.  Also, no weight loss, no night sweats or cough.  Past Medical History:  Diagnosis Date  . 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 Pulmonary tuberculosis 1989   Treated in [place]  . Ulcerative colitis [facility])     Past Surgical History:  Procedure Laterality Date  . BIOPSY  06/07/2019   Procedure: BIOPSY;  Surgeon: [name], MD;  [facility];  Service: Endoscopy;;  . COLONOSCOPY N/A 06/07/2019   Procedure: COLONOSCOPY;  Surgeon: [name], MD; diffusely inflamed colon and rectum most consistent with ulcerative colitis/IBD, normal-appearing TI s/p segmental biopsies.  Pathology with chronic mildly active colitis without granulomata, dysplasia, or malignancy.  Findings most likely secondary to UC.  Marland Kitchen MOUTH SURGERY  2010   Surgery for mouth cancer     Prior to Admission medications   Medication Sig Start Date End Date Taking? Authorizing Provider  predniSONE (DELTASONE) 10 MG tablet Take 30 mg by mouth daily with breakfast.   Yes [provider]    Allergies as of 05/26/2020  . (No Known Allergies)    Family History  Problem Relation Age of Onset  . Diabetes Mother   . Cancer Father   . Colon cancer Neg Hx   . Inflammatory bowel disease Neg Hx   . Liver disease Neg Hx     Social History   Socioeconomic History  . Marital status: Married    Spouse name: Not on file  . Number of children: Not on file  . Years of education: Not on file  . Highest education level: Not on file  Occupational History    Comment: Scientist, water quality at [employer].   Tobacco Use  . Smoking status: Never Smoker  . Smokeless tobacco: Never Used  Substance and Sexual Activity  . Alcohol use: Never  . Drug use: Never  . Sexual activity: Not on file  Other Topics Concern  . 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   Social Determinants of Health   Financial Resource Strain: Not on file  Food Insecurity: Not on file  Transportation Needs: Not on file  Physical Activity: Not on file  Stress: Not on file  Social Connections: Not on file  Intimate Partner Violence: Not on file    Review of Systems: See HPI, otherwise negative ROS  Physical Exam: BP Marland Kitchen)  156/94   Pulse 87   Temp 97.8 F (36.6 C)   Ht 4' 10"  (1.473 m)   Wt 154 lb 12.8 oz (70.2 kg)   BMI 32.35 kg/m  General:   Alert,   pleasant and cooperative in NAD Neck:  Supple; no masses or thyromegaly. No significant cervical adenopathy. Lungs:  Clear throughout to auscultation.   No wheezes, crackles, or rhonchi. No acute distress. Heart:  Regular rate and rhythm; no murmurs, clicks, rubs,  or gallops. Abdomen: Non-distended, normal bowel sounds.  Soft and nontender without appreciable mass or hepatosplenomegaly.  Pulses:  Normal  pulses noted. Extremities:  Without clubbing or edema.  Impression/Plan: 60 year old [place] gentleman with transaminitis and biopsies more consistent with autoimmune hepatitis rather than PBC/PSC or overlap syndrome.  MRCP negative for any evidence of PSC grossly. Patient would likely benefit from long-term immunomodulators therapy for both autoimmune hepatitis and ulcerative colitis, overlapping with prednisone for a finite period. As discussed above, T MPT enzyme assay is within normal limits.  I spoke with Dr. Elba Barman, [place] physician in [place] while patient was here..  Given his history of TB previously, he recommended doing nothing more than getting a updated chest x-ray before beginning treatment with Imuran.  I discussed the risk and benefits of long-term immunomodulatory later therapy including the risk of neoplasm/lymphoproliferative disorders.  Patient and son's questions answered via interpreter.   Recommendations:  Continue prednisone 30 mg for now  I spoke to the infectious disease doctor.  He recommended just getting a chest x-ray to make sure there is no evidence of active tuberculosis prior to beginning Imuran.  Office visit with [place] in 4 to 6 weeks  Once I have the chest x-ray results back for review, we will call a prescription in for Imuran as appropriate  Patient's son, Bobbie Stack -phone number [phone]    Notice: This dictation was prepared with Dragon dictation along with smaller phrase technology. Any transcriptional errors that result from this process are unintentional and may not be corrected upon review.

## 2020-05-26 NOTE — Patient Instructions (Signed)
Continue prednisone 30 mg for now  I spoke to the infectious disease doctor.  He recommended just getting a chest x-ray to make sure there is no evidence of active tuberculosis for beginning Imuran.  Office visit with [place] in 4 to 6 weeks  Once I have the chest x-ray results back for review, we will call a prescription in for Imuran as appropriate

## 2020-05-27 ENCOUNTER — Ambulatory Visit: Payer: 59 | Admitting: Gastroenterology

## 2020-05-28 ENCOUNTER — Other Ambulatory Visit: Payer: Self-pay

## 2020-05-28 ENCOUNTER — Ambulatory Visit (HOSPITAL_COMMUNITY)
Admission: RE | Admit: 2020-05-28 | Discharge: 2020-05-28 | Disposition: A | Discharge status: Home / Self Care | Payer: 59 | Source: Ambulatory Visit | Attending: Internal Medicine | Admitting: Internal Medicine

## 2020-05-28 DIAGNOSIS — Z8611 Personal history of tuberculosis: Secondary | ICD-10-CM | POA: Diagnosis not present

## 2020-05-31 ENCOUNTER — Telehealth: Payer: Self-pay | Admitting: Internal Medicine

## 2020-05-31 NOTE — Telephone Encounter (Signed)
Chest x-ray negative.  We will proceed with adding Imuran to the regimen.  Please call in a prescription for Imuran to [facility] 50 mg once daily dispense 30 doses.  No refills.  Need hepatic function profile and CBC in 2 weeks.  Office visit with Aliene Altes in 4 weeks.  I called Sahil , [phone], patient's son and updated him.  I informed him of the need for close clinical monitoring including blood work.  He should continue taking prednisone 30 mg while Imuran is started.  Sahil tells me patient noticed some blood in his mouth after taking his prednisone recently.  No oral lesions or mouth discomfort.  I doubt causation.  However, I suggested that he touch base with Dr. Anastasio Champion regarding oral bleeding.  We will see him back in 1 month to assess his progress.

## 2020-05-31 NOTE — Telephone Encounter (Signed)
Carl Reynolds, patient already has follow-up scheduled 4/27. OK to leave this appointment in place as his follow-up.

## 2020-05-31 NOTE — Telephone Encounter (Signed)
Chest x ray negative;  Will start Imuran 83m once daily x 1 month (disp 30 doses - no refills). Continue prednisone 30 mg daily.  CBC, Hepatic profile in 2 weeks. OV with KAliene Altesin 1 month. I called Sahil at [phone]; updated him. The need for close clinical monitoring including blood work reviewed.  He tells me pt spit some blood up after taking prednisone the other day - asked if it is related.  No mouth soreness or lesions.  I recommended he touch base with Dr. GAnastasio Championabout this.  I recommended he continue prednisone.

## 2020-06-01 ENCOUNTER — Other Ambulatory Visit: Payer: Self-pay

## 2020-06-01 DIAGNOSIS — Z79899 Other long term (current) drug therapy: Secondary | ICD-10-CM

## 2020-06-01 DIAGNOSIS — Z8611 Personal history of tuberculosis: Secondary | ICD-10-CM

## 2020-06-01 MED ORDER — AZATHIOPRINE 50 MG PO TABS: 50.0000 mg | ORAL_TABLET | Freq: Every day | ORAL | 0 refills | Status: DC

## 2020-06-01 NOTE — Telephone Encounter (Signed)
Imuran 50 mg one tab daily was sent to pts pharmacy. Lab orders placed and mailed to pt.

## 2020-06-17 LAB — CBC WITH DIFFERENTIAL/PLATELET
Absolute Monocytes: 845 cells/uL (ref 200–950)
Basophils Absolute: 103 cells/uL (ref 0–200)
Basophils Relative: 1.3 %
Eosinophils Absolute: 340 cells/uL (ref 15–500)
Eosinophils Relative: 4.3 %
HCT: 42.3 % (ref 38.5–50.0)
Hemoglobin: 14.1 g/dL (ref 13.2–17.1)
Lymphs Abs: 2686 cells/uL (ref 850–3900)
MCH: 29.7 pg (ref 27.0–33.0)
MCHC: 33.3 g/dL (ref 32.0–36.0)
MCV: 89.2 fL (ref 80.0–100.0)
MPV: 9 fL (ref 7.5–12.5)
Monocytes Relative: 10.7 %
Neutro Abs: 3926 cells/uL (ref 1500–7800)
Neutrophils Relative %: 49.7 %
Platelets: 401 10*3/uL — ABNORMAL HIGH (ref 140–400)
RBC: 4.74 10*6/uL (ref 4.20–5.80)
RDW: 11.7 % (ref 11.0–15.0)
Total Lymphocyte: 34 %
WBC: 7.9 10*3/uL (ref 3.8–10.8)

## 2020-06-17 LAB — HEPATIC FUNCTION PANEL
AG Ratio: 1.5 (calc) (ref 1.0–2.5)
ALT: 26 U/L (ref 9–46)
AST: 40 U/L — ABNORMAL HIGH (ref 10–35)
Albumin: 3.9 g/dL (ref 3.6–5.1)
Alkaline phosphatase (APISO): 88 U/L (ref 35–144)
Bilirubin, Direct: 0.2 mg/dL (ref 0.0–0.2)
Globulin: 2.6 g/dL (calc) (ref 1.9–3.7)
Indirect Bilirubin: 0.7 mg/dL (calc) (ref 0.2–1.2)
Total Bilirubin: 0.9 mg/dL (ref 0.2–1.2)
Total Protein: 6.5 g/dL (ref 6.1–8.1)

## 2020-06-24 ENCOUNTER — Other Ambulatory Visit: Payer: Self-pay

## 2020-06-24 DIAGNOSIS — Z79899 Other long term (current) drug therapy: Secondary | ICD-10-CM

## 2020-06-24 DIAGNOSIS — Z8611 Personal history of tuberculosis: Secondary | ICD-10-CM

## 2020-06-28 ENCOUNTER — Other Ambulatory Visit: Payer: Self-pay | Admitting: Internal Medicine

## 2020-07-01 ENCOUNTER — Other Ambulatory Visit: Payer: Self-pay

## 2020-07-02 ENCOUNTER — Encounter (INDEPENDENT_AMBULATORY_CARE_PROVIDER_SITE_OTHER): Payer: Self-pay | Admitting: Internal Medicine

## 2020-07-02 ENCOUNTER — Ambulatory Visit (INDEPENDENT_AMBULATORY_CARE_PROVIDER_SITE_OTHER): Payer: 59 | Admitting: Internal Medicine

## 2020-07-02 ENCOUNTER — Other Ambulatory Visit: Payer: Self-pay

## 2020-07-02 VITALS — BP 103/80 | HR 101 | Temp 97.3°F | Resp 19 | Ht 63.0 in | Wt 154.0 lb

## 2020-07-02 DIAGNOSIS — R7303 Prediabetes: Secondary | ICD-10-CM

## 2020-07-02 DIAGNOSIS — E785 Hyperlipidemia, unspecified: Secondary | ICD-10-CM

## 2020-07-02 LAB — CBC WITH DIFFERENTIAL/PLATELET
Absolute Monocytes: 920 cells/uL (ref 200–950)
Basophils Absolute: 129 cells/uL (ref 0–200)
Basophils Relative: 1.5 %
Eosinophils Absolute: 894 cells/uL — ABNORMAL HIGH (ref 15–500)
Eosinophils Relative: 10.4 %
HCT: 45.9 % (ref 38.5–50.0)
Hemoglobin: 15.4 g/dL (ref 13.2–17.1)
Lymphs Abs: 2589 cells/uL (ref 850–3900)
MCH: 29.9 pg (ref 27.0–33.0)
MCHC: 33.6 g/dL (ref 32.0–36.0)
MCV: 89.1 fL (ref 80.0–100.0)
MPV: 9.1 fL (ref 7.5–12.5)
Monocytes Relative: 10.7 %
Neutro Abs: 4068 cells/uL (ref 1500–7800)
Neutrophils Relative %: 47.3 %
Platelets: 327 10*3/uL (ref 140–400)
RBC: 5.15 10*6/uL (ref 4.20–5.80)
RDW: 11.9 % (ref 11.0–15.0)
Total Lymphocyte: 30.1 %
WBC: 8.6 10*3/uL (ref 3.8–10.8)

## 2020-07-02 LAB — HEPATIC FUNCTION PANEL
AG Ratio: 1.4 (calc) (ref 1.0–2.5)
ALT: 20 U/L (ref 9–46)
AST: 33 U/L (ref 10–35)
Albumin: 4.1 g/dL (ref 3.6–5.1)
Alkaline phosphatase (APISO): 93 U/L (ref 35–144)
Bilirubin, Direct: 0.2 mg/dL (ref 0.0–0.2)
Globulin: 2.9 g/dL (calc) (ref 1.9–3.7)
Indirect Bilirubin: 0.6 mg/dL (calc) (ref 0.2–1.2)
Total Bilirubin: 0.8 mg/dL (ref 0.2–1.2)
Total Protein: 7 g/dL (ref 6.1–8.1)

## 2020-07-02 NOTE — Progress Notes (Signed)
Metrics: Intervention Frequency ACO  Documented Smoking Status Yearly  Screened one or more times in 24 months  Cessation Counseling or  Active cessation medication Past 24 months  Past 24 months   Guideline developer: UpToDate (See UpToDate for funding source) Date Released: 2014       Wellness Office Visit  Subjective:  Patient ID: Carl Reynolds, male    DOB: Nov 11, 1960  Age: 60 y.o. MRN: [id number]  CC: This man comes in for follow-up of prediabetes, hyperlipidemia. HPI  He was seen by Dr. Gala Romney, gastroenterology approximately 3 weeks ago and started on Imuran for his ulcerative colitis.  Prednisone was discontinued. Past Medical History:  Diagnosis Date  . 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 Pulmonary tuberculosis 1989   Treated in [place]  . Ulcerative colitis [facility])    Past Surgical History:  Procedure Laterality Date  . BIOPSY  06/07/2019   Procedure: BIOPSY;  Surgeon: [name], MD;  [facility];  Service: Endoscopy;;  . COLONOSCOPY N/A 06/07/2019   Procedure: COLONOSCOPY;  Surgeon: [name], MD; diffusely inflamed colon and rectum most consistent with ulcerative colitis/IBD, normal-appearing TI s/p segmental biopsies.  Pathology with chronic mildly active colitis without granulomata, dysplasia, or malignancy.  Findings most likely secondary to UC.  Marland Kitchen MOUTH SURGERY  2010   Surgery for mouth cancer     Family History  Problem Relation Age of Onset  . Diabetes Mother   . Cancer Father   . Colon cancer Neg Hx   . Inflammatory bowel disease Neg Hx   . 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   Social History   Tobacco Use  . Smoking status: Never Smoker  . Smokeless tobacco: Never Used  Substance Use Topics  . Alcohol use: Never    Current Meds  Medication Sig  .  azaTHIOprine (IMURAN) 50 MG tablet TAKE 1 TABLET(50 MG) BY MOUTH DAILY       Objective:   Today's Vitals: BP 103/80 (BP Location: Right Arm, Patient Position: Sitting, Cuff Size: Normal)   Pulse (!) 101   Temp (!) 97.3 F (36.3 C) (Temporal)   Resp 19   Ht 5' 3"  (1.6 m)   Wt 154 lb (69.9 kg)   SpO2 98%   BMI 27.28 kg/m  Vitals with BMI 07/02/2020 05/26/2020 04/28/2020  Height 5' 3"  4' 10"  5' 3"   Weight 154 lbs 154 lbs 13 oz 153 lbs  BMI 27.29 95.62 13.08  Systolic 657 846 962  Diastolic 80 94 90  Pulse 952 87 72     Physical Exam  He looks systemically well.  No new physical findings.  Blood pressure acceptable/in a good range.     Assessment   1. Prediabetes   2. Dyslipidemia       Tests ordered No orders of the defined types were placed in this encounter.    Plan: 1. I was going to order blood work but the patient informed me that he is moving out of town permanently.  I have given him relevant information regarding his health that he can take with him to his new physician wherever he permanently settles.  We will make no further appointments.   No orders of the defined types were placed in this encounter.  Doree Albee, MD

## 2020-07-02 NOTE — Telephone Encounter (Signed)
Prev recommendation for Imuran (due to neg CT chest) and follow-up in 4 weeks. Follow-up appointment next week. Will refill 1 additional month to get him to follow-up visit and can further address by Roseanne Kaufman, NP 07/08/20

## 2020-07-03 ENCOUNTER — Other Ambulatory Visit: Payer: Self-pay | Admitting: Nurse Practitioner

## 2020-07-07 ENCOUNTER — Other Ambulatory Visit: Payer: Self-pay

## 2020-07-07 DIAGNOSIS — Z8611 Personal history of tuberculosis: Secondary | ICD-10-CM

## 2020-07-07 DIAGNOSIS — Z79899 Other long term (current) drug therapy: Secondary | ICD-10-CM

## 2020-07-07 DIAGNOSIS — R898 Other abnormal findings in specimens from other organs, systems and tissues: Secondary | ICD-10-CM

## 2020-07-08 ENCOUNTER — Other Ambulatory Visit: Payer: Self-pay

## 2020-07-08 ENCOUNTER — Encounter: Payer: Self-pay | Admitting: Internal Medicine

## 2020-07-08 ENCOUNTER — Encounter: Payer: Self-pay | Admitting: Gastroenterology

## 2020-07-08 ENCOUNTER — Ambulatory Visit (INDEPENDENT_AMBULATORY_CARE_PROVIDER_SITE_OTHER): Payer: 59 | Admitting: Gastroenterology

## 2020-07-08 VITALS — BP 134/89 | HR 123 | Temp 96.9°F | Ht 63.0 in | Wt 153.4 lb

## 2020-07-08 DIAGNOSIS — K754 Autoimmune hepatitis: Secondary | ICD-10-CM

## 2020-07-08 DIAGNOSIS — K5100 Ulcerative (chronic) pancolitis without complications: Secondary | ICD-10-CM | POA: Diagnosis not present

## 2020-07-08 NOTE — Progress Notes (Addendum)
Referring Provider: Glenda Chroman, MD Primary Care Physician:  Glenda Chroman, MD Primary GI: Dr. Gala Romney   Chief Complaint  Patient presents with   Ulcerative Colitis    HPI:   Carl Reynolds is a 60 y.o. 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 MRCP negative. Has been on prednisone since Nov 2021 starting at 60 mg and then was tapered to 30 mg daily. Normal TPMT enzyme activity. 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 CXR negative in March 2022. Started imuran 3/20 at 50 mg daily. Interpreter present on the phone today.   He returns today stating he is taking 50 mg prednisone. Does not have medications with him. He does believe he is taking Imuran 50 mg daily. Not clear amount of prednisone he is taking. Denies abdominal pain, N/V, rectal bleeding. Has a good appetite. He states he will be returning to [place] for at least a year in the fall. His most recent LFTs on 4/20 are completely normal.    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  Pathology with chronic mildly active colitis without granulomata, dysplasia, or malignancy.  Findings most likely secondary to UC.   MOUTH SURGERY  2010   Surgery for mouth cancer    Current  Outpatient Medications  Medication Sig Dispense Refill   azaTHIOprine (IMURAN) 50 MG tablet TAKE 1 TABLET(50 MG) BY MOUTH DAILY 30 tablet 0   No current facility-administered medications for this visit.    Allergies as of 07/08/2020   (No Known Allergies)    Family History  Problem Relation Age of Onset   Diabetes Mother    Cancer Father    Colon cancer Neg Hx    Inflammatory bowel disease Neg Hx    Liver disease Neg Hx     Social History   Socioeconomic History   Marital status: Married    Spouse name: Not on file   Number of children: Not on file   Years of education: Not on file   Highest education level: Not on file  Occupational History    Comment: Scientist, water quality at [employer].   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   Social Determinants of Health   Financial Resource Strain: Not on file  Food Insecurity: Not on file  Transportation Needs: Not on file  Physical Activity: Not on file  Stress: Not on file  Social Connections: Not on file    Review of  Systems: Gen: Denies fever, chills, anorexia. Denies fatigue, weakness, weight loss.  CV: Denies chest pain, palpitations, syncope, peripheral edema, and claudication. Resp: Denies dyspnea at rest, cough, wheezing, coughing up blood, and pleurisy. GI: see HPI Derm: Denies rash, itching, dry skin Psych: Denies depression, anxiety, memory loss, confusion. No homicidal or suicidal ideation.  Heme: Denies bruising, bleeding, and enlarged lymph nodes.  Physical Exam: BP 134/89   Pulse (!) 123   Temp (!) 96.9 F (36.1 C) (Temporal)   Ht 5' 3"  (1.6 m)   Wt 153 lb 6.4 oz (69.6 kg)   BMI 27.17 kg/m  General:   Alert and oriented. No distress noted. Pleasant and  cooperative.  Head:  Normocephalic and atraumatic. Eyes:  Conjuctiva clear without scleral icterus. Mouth:  Mask in place Abdomen:  +BS, soft, non-tender and non-distended. No rebound or guarding. No HSM or masses noted. Msk:  Symmetrical without gross deformities. Normal posture. Extremities:  Without edema. Neurologic:  Alert and  oriented x4 Psych:  Alert and cooperative. Normal mood and affect.  ASSESSMENT/PLAN: [name] is a 60 y.o. 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 MRCP negative. Clinically, he is doing well without abdominal pain, N/V, rectal bleeding. However, it is unclear his current medication regimen.   AIH: has been on prednisone since Nov 2021 starting at 60 mg and then was tapered to 30 mg daily. He is  Unsure how much he is taking. Imuran was started on 3/20 at 50 mg daily (normal TPMT activity).  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 CXR negative in March 2022.   Ideally, we would start tapering prednisone by 5-10 mg per week to a maintanence dose of 10 mg daily with Imuran, while checking HFP weekly during the taper. As he is on Imuran as well, he can be at a maintenanc dose of 10 mg daily as it is not monotherapy, holding constant the Imuran dosage. If recurrent symptoms or worsening lab values, may need to increase prednisone again.   I have asked that patient bring by all of his medications to the clinic tomorrow so we may review in detail prior to further recommendations.  Annitta Needs, PhD, ANP-BC [facility]        ADDENDUM: Patient brought medications to clinic as noted in phone note dated 07/13/20. ONLY taking azathioprine 50 mg once daily. He weaned off prednisone on own. Will keep him on monotherapy right now. Closely follow labs. Keep upcoming appt in Aug 2022.  Annitta Needs, PhD, ANP-BC [facility]

## 2020-07-08 NOTE — Patient Instructions (Signed)
Please bring your medications to our office tomorrow so I can look at them.  The goal is to taper slowly off the prednisone and follow blood work closely. We will have a plan tomorrow!   It was a pleasure to see you today. I want to create trusting relationships with patients to provide genuine, compassionate, and quality care. I value your feedback. If you receive a survey regarding your visit,  I greatly appreciate you taking time to fill this out.   Annitta Needs, PhD, ANP-BC [facility]

## 2020-07-13 ENCOUNTER — Telehealth: Payer: Self-pay | Admitting: Gastroenterology

## 2020-07-13 NOTE — Telephone Encounter (Signed)
Tried calling pts son. VM is full, will call back.

## 2020-07-13 NOTE — Telephone Encounter (Signed)
[facility] Nurse/Alicia:  Patient was to bring by his medications for me to look at last Friday but did not show. Can we remind him to come by WITH all of his medications? I need to see what prednisone dosage he is taking.

## 2020-07-14 NOTE — Telephone Encounter (Signed)
Mr. Yost walked in office. Pt is only taking one medication daily and no otc medications. Medication name is Azathioprine 50 mg once daily.

## 2020-08-01 ENCOUNTER — Other Ambulatory Visit: Payer: Self-pay | Admitting: Nurse Practitioner

## 2020-08-03 LAB — CBC WITH DIFFERENTIAL/PLATELET
Absolute Monocytes: 848 cells/uL (ref 200–950)
Basophils Absolute: 126 cells/uL (ref 0–200)
Basophils Relative: 1.5 %
Eosinophils Absolute: 806 cells/uL — ABNORMAL HIGH (ref 15–500)
Eosinophils Relative: 9.6 %
HCT: 43.7 % (ref 38.5–50.0)
Hemoglobin: 14.6 g/dL (ref 13.2–17.1)
Lymphs Abs: 2579 cells/uL (ref 850–3900)
MCH: 29.6 pg (ref 27.0–33.0)
MCHC: 33.4 g/dL (ref 32.0–36.0)
MCV: 88.5 fL (ref 80.0–100.0)
MPV: 8.9 fL (ref 7.5–12.5)
Monocytes Relative: 10.1 %
Neutro Abs: 4040 cells/uL (ref 1500–7800)
Neutrophils Relative %: 48.1 %
Platelets: 458 10*3/uL — ABNORMAL HIGH (ref 140–400)
RBC: 4.94 10*6/uL (ref 4.20–5.80)
RDW: 12.1 % (ref 11.0–15.0)
Total Lymphocyte: 30.7 %
WBC: 8.4 10*3/uL (ref 3.8–10.8)

## 2020-08-03 LAB — HEPATIC FUNCTION PANEL
AG Ratio: 1.3 (calc) (ref 1.0–2.5)
ALT: 18 U/L (ref 9–46)
AST: 23 U/L (ref 10–35)
Albumin: 4.1 g/dL (ref 3.6–5.1)
Alkaline phosphatase (APISO): 94 U/L (ref 35–144)
Bilirubin, Direct: 0.1 mg/dL (ref 0.0–0.2)
Globulin: 3.2 g/dL (calc) (ref 1.9–3.7)
Indirect Bilirubin: 0.5 mg/dL (calc) (ref 0.2–1.2)
Total Bilirubin: 0.6 mg/dL (ref 0.2–1.2)
Total Protein: 7.3 g/dL (ref 6.1–8.1)

## 2020-09-02 ENCOUNTER — Telehealth: Payer: Self-pay | Admitting: Internal Medicine

## 2020-09-02 NOTE — Telephone Encounter (Signed)
Please send patient medication to [facility] for a refill.

## 2020-09-06 IMAGING — US US ABDOMEN COMPLETE
1 series · 14 of 25 positions shown · non-contrast
Comparison: Abdominal ultrasound 04/25/2019.

CLINICAL DATA: Elevated liver function tests.

EXAM:
ABDOMEN ULTRASOUND COMPLETE

[Series 1: us abdomen complete · 14 of 124 slices shown]
[im 1/124]
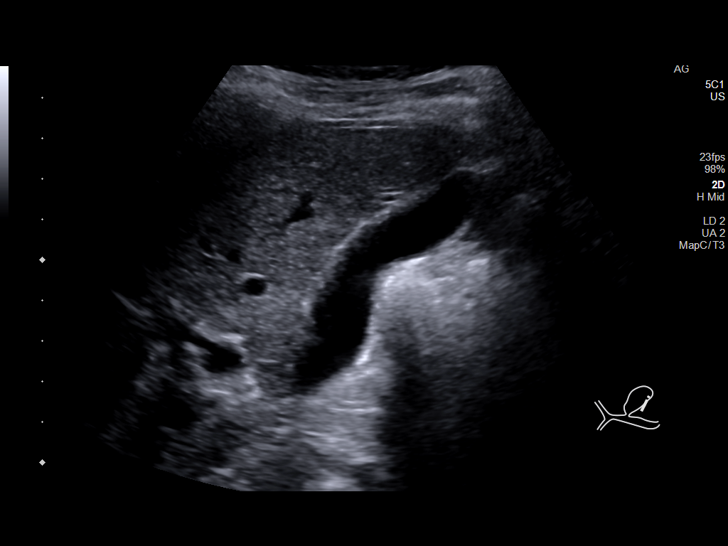
[im 11/124]
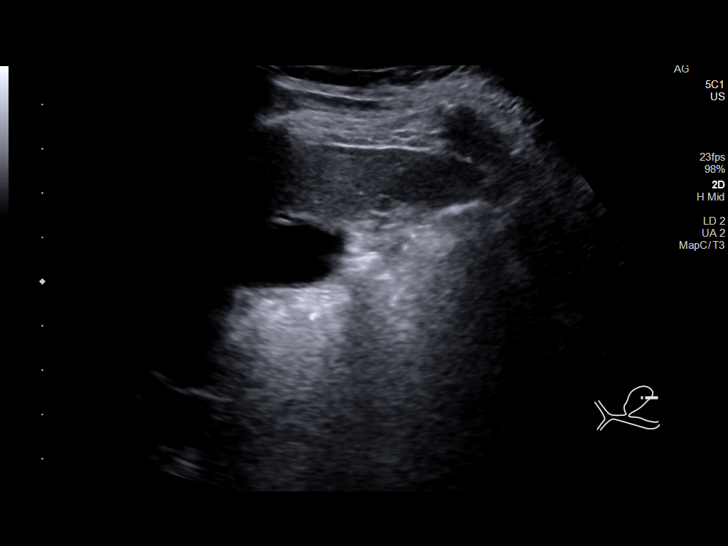
[im 21/124]
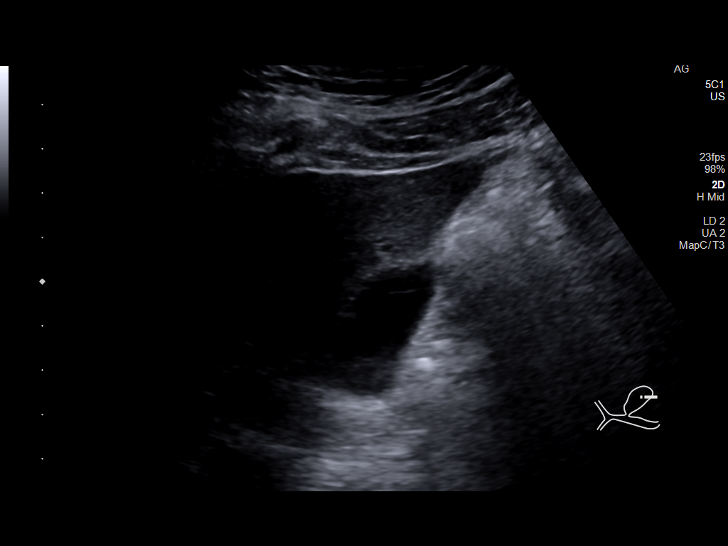
[im 31/124]
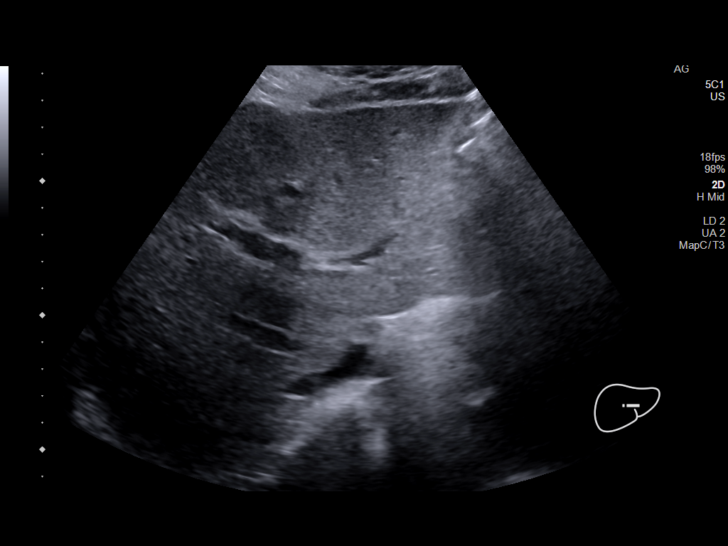
[im 42/124]
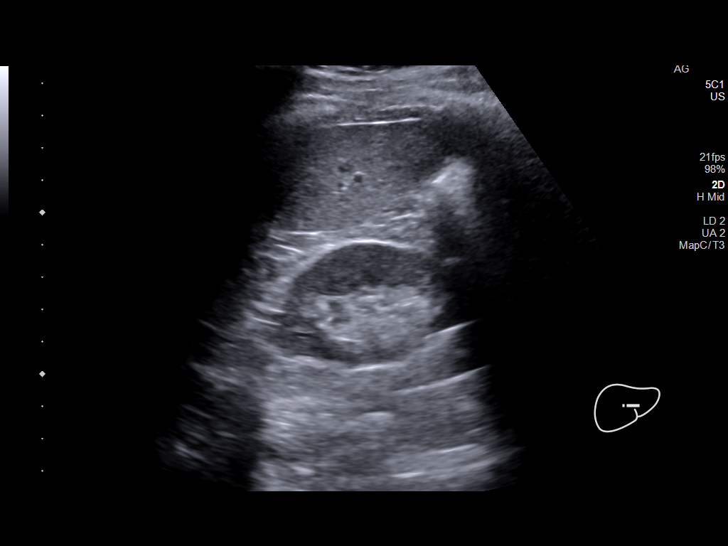
[im 47/124]
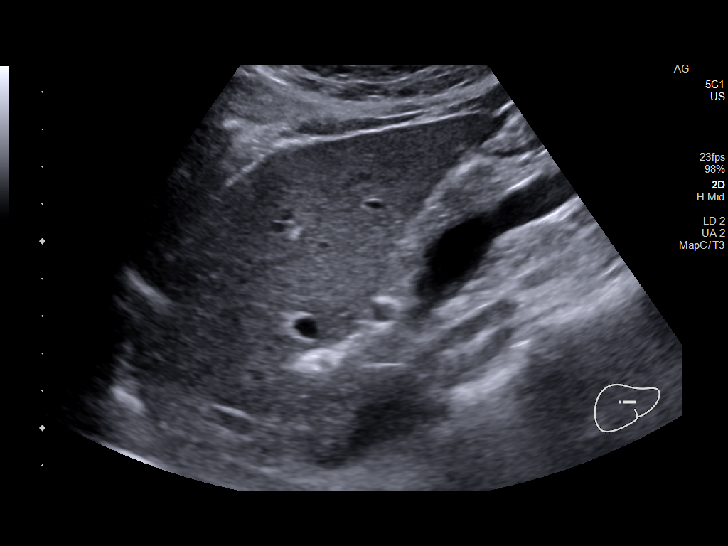
[im 57/124]
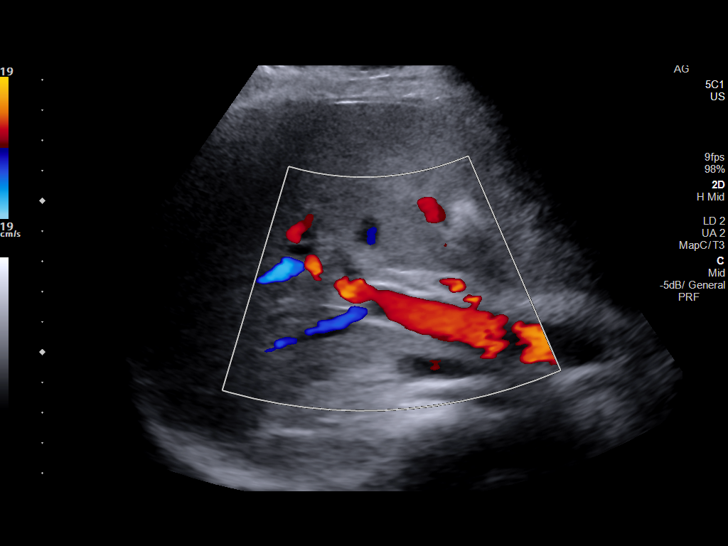
[im 67/124]
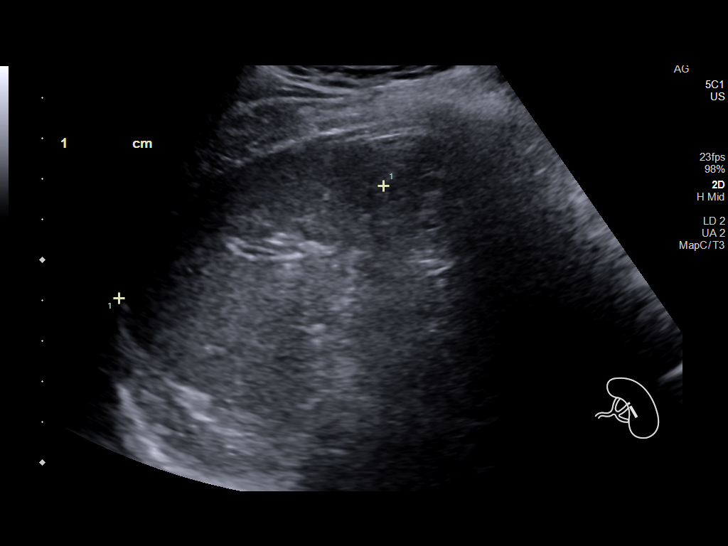
[im 77/124]
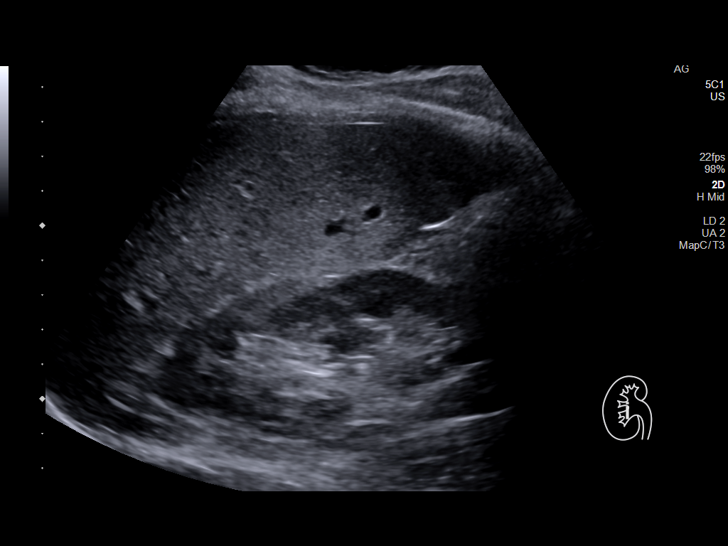
[im 83/124]
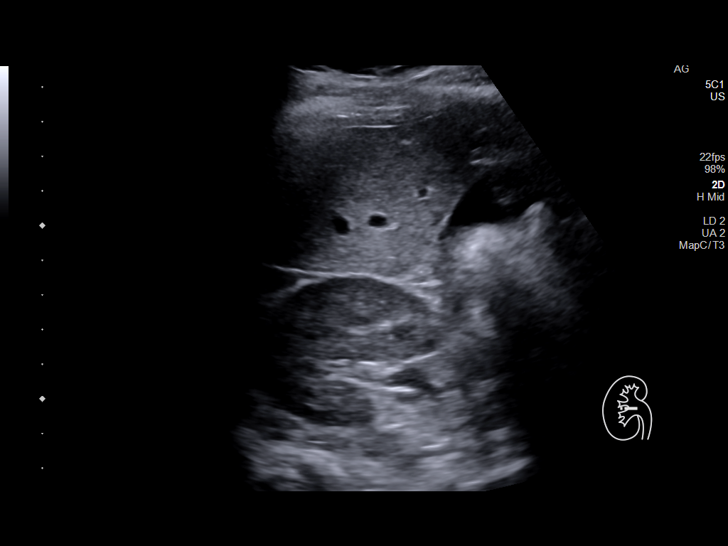
[im 93/124]
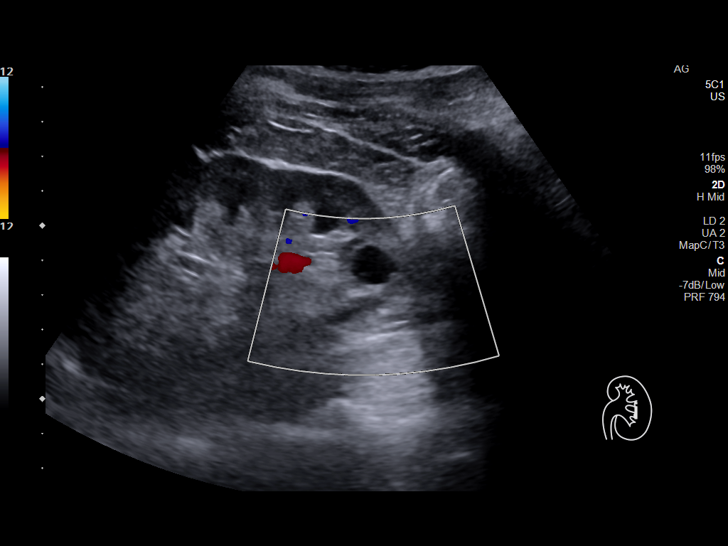
[im 103/124]
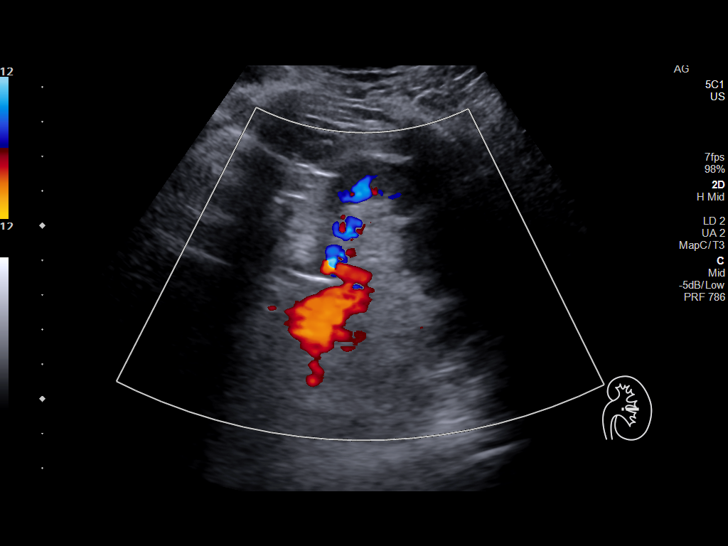
[im 113/124]
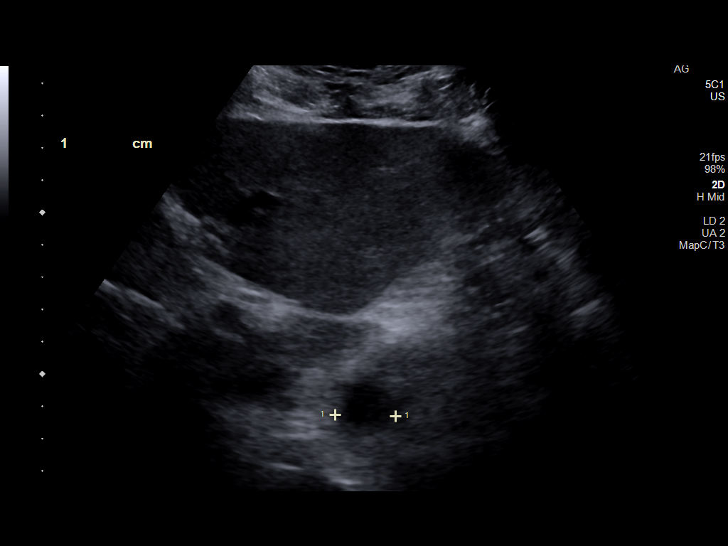
[im 124/124]
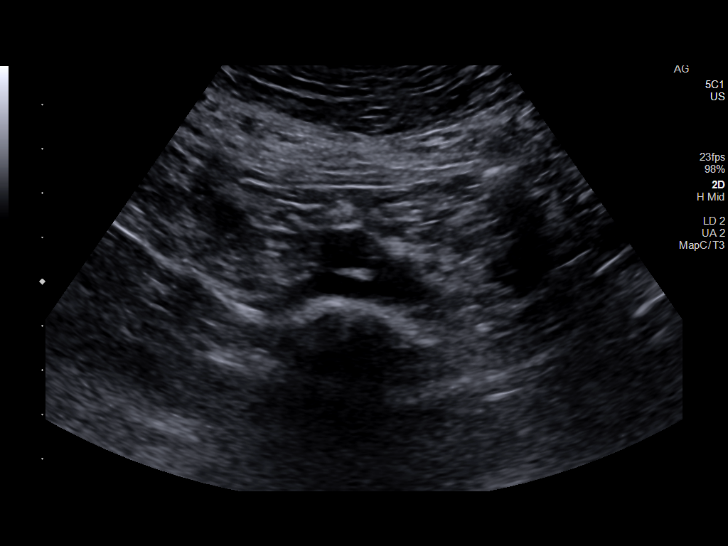

[14 of 25 positions shown; findings below may reference images not displayed]

FINDINGS: Gallbladder: No gallstones or wall thickening visualized. No
sonographic Murphy sign noted by sonographer.

Common bile duct: Diameter: 0.4 cm

Liver: No focal lesion identified. Within normal limits in
parenchymal echogenicity. Portal vein is patent on color Doppler
imaging with normal direction of blood flow towards the liver.

IVC: No abnormality visualized.

Pancreas: Visualized portion unremarkable.

Spleen: Size and appearance within normal limits.

Right Kidney: Length: 10.3 cm. Echogenicity within normal limits. No
mass or hydronephrosis visualized.

Left Kidney: Length: 10.1 cm. Echogenicity within normal limits. No
solid mass or hydronephrosis visualized. 1.5 cm simple cyst in lower
pole noted.

Abdominal aorta: No aneurysm visualized.

Other findings: None.
IMPRESSION: Negative exam.  No change compared to the prior study.

## 2020-09-10 ENCOUNTER — Other Ambulatory Visit: Payer: Self-pay | Admitting: *Deleted

## 2020-09-10 ENCOUNTER — Encounter: Payer: Self-pay | Admitting: *Deleted

## 2020-09-10 DIAGNOSIS — K754 Autoimmune hepatitis: Secondary | ICD-10-CM

## 2020-10-20 ENCOUNTER — Encounter: Payer: Self-pay | Admitting: Internal Medicine

## 2020-10-20 ENCOUNTER — Ambulatory Visit: Payer: 59 | Admitting: Gastroenterology

## 2021-08-16 IMAGING — DX DG CHEST 2V
2 series · 2 of 2 positions shown · non-contrast
Comparison: 04/23/2019

CLINICAL DATA: Pretreatment evaluation, history of TB

EXAM:
CHEST - 2 VIEW

[chest pa]
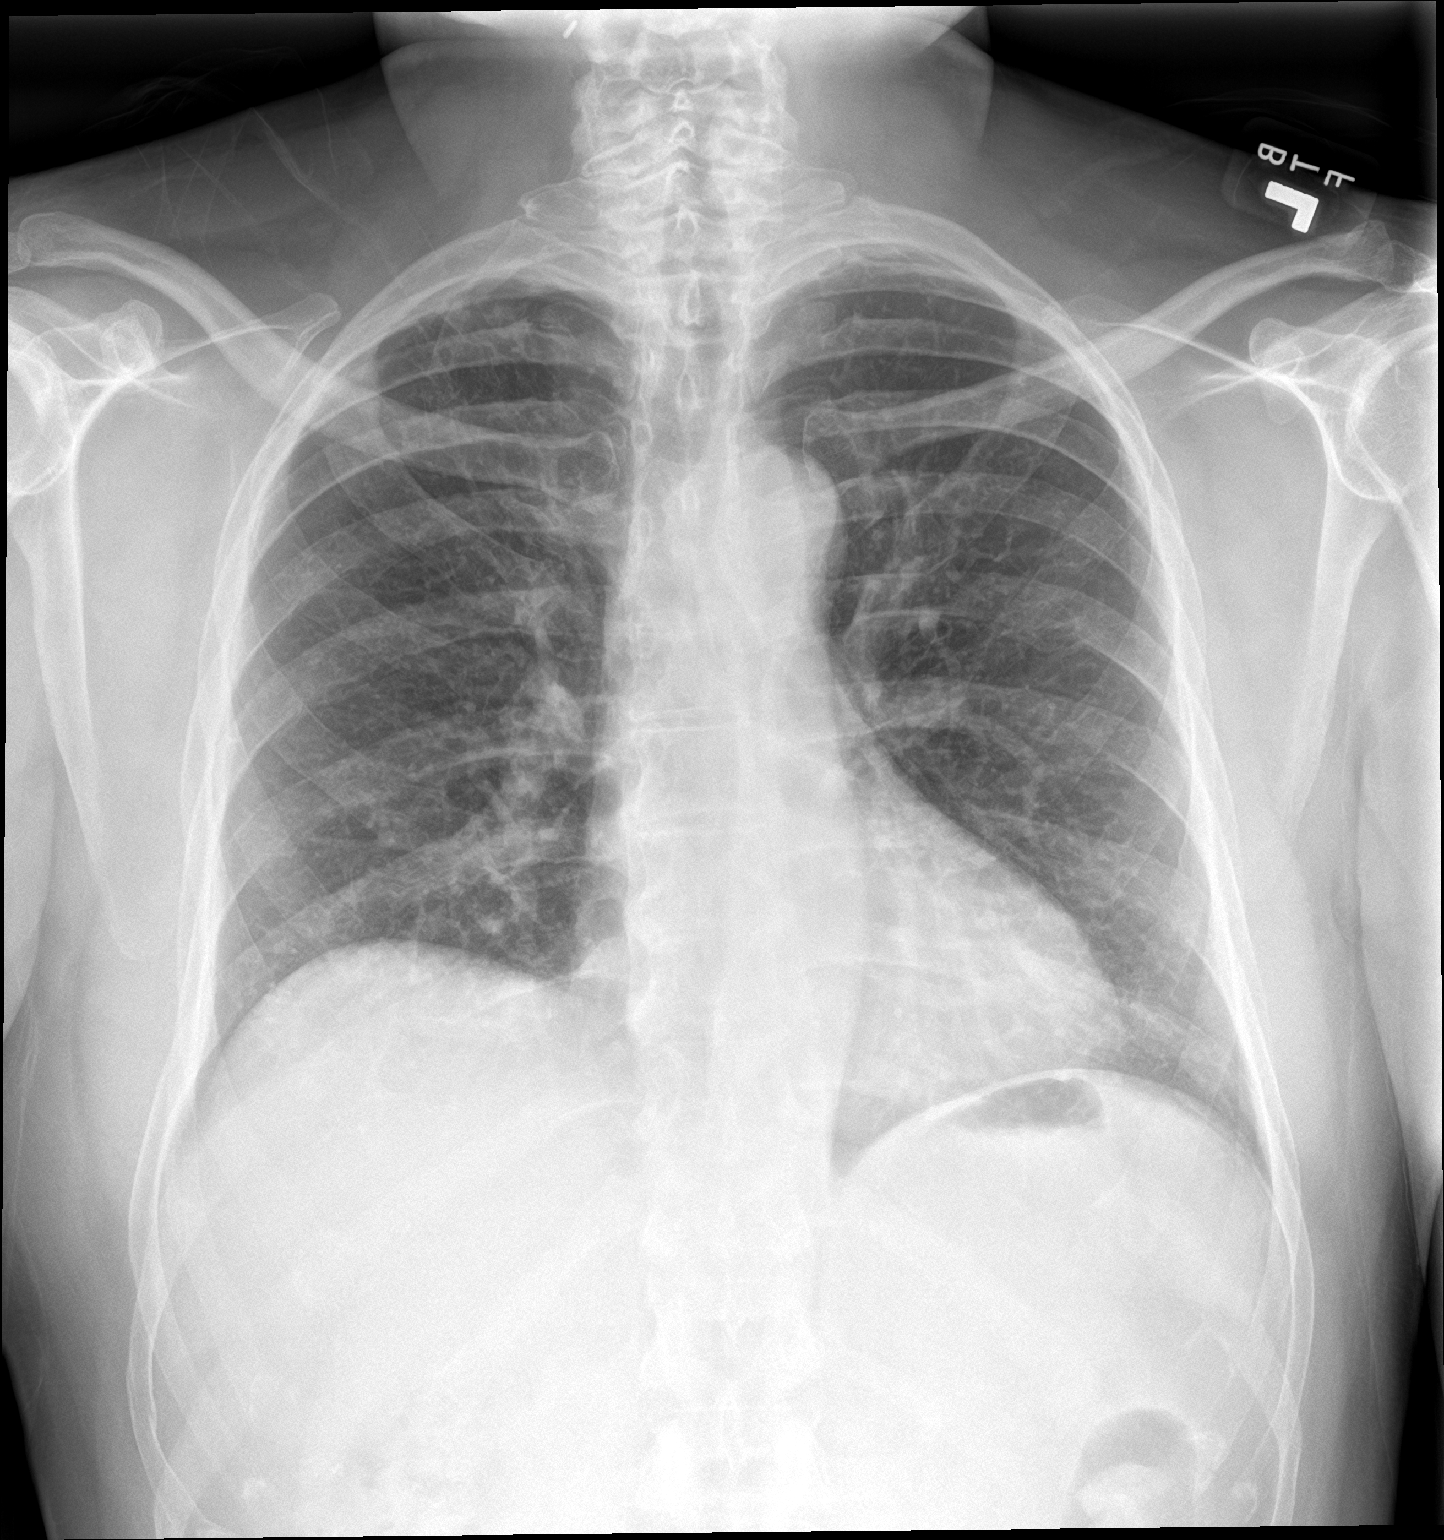

[chest lat]
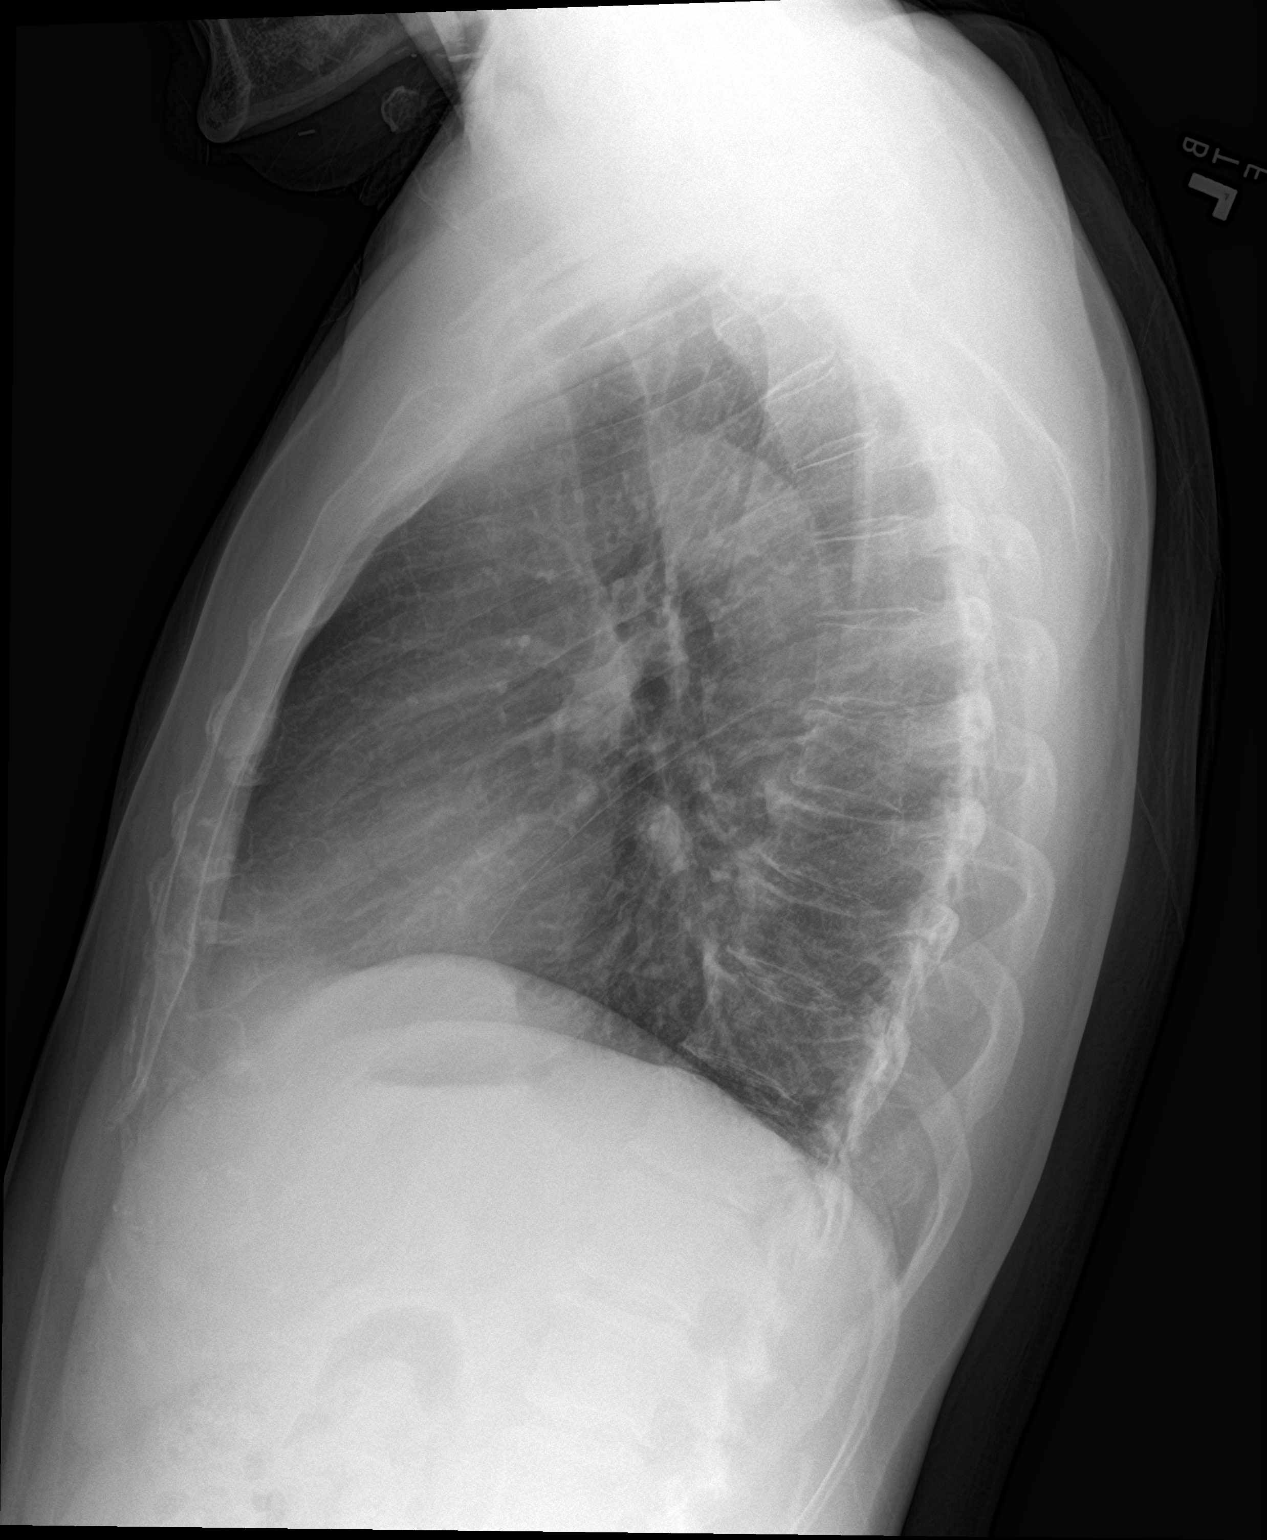

[2 of 2 positions shown; findings below may reference images not displayed]

FINDINGS: The heart size and mediastinal contours are within normal limits.
Both lungs are clear. No pleural effusion. The visualized skeletal
structures are unremarkable.
IMPRESSION: No acute process in the chest.
